# Patient Record
Sex: Male | Born: 1950 | Race: White | Hispanic: No | Marital: Married | Smoking: Current every day smoker
Health system: Southern US, Community
[De-identification: ages and names within clinical notes are randomized; demographics above are authoritative.]

## PROBLEM LIST (undated history)

## (undated) DIAGNOSIS — F319 Bipolar disorder, unspecified: Secondary | ICD-10-CM

## (undated) DIAGNOSIS — F4310 Post-traumatic stress disorder, unspecified: Secondary | ICD-10-CM

## (undated) DIAGNOSIS — K219 Gastro-esophageal reflux disease without esophagitis: Secondary | ICD-10-CM

## (undated) DIAGNOSIS — S069X9A Unspecified intracranial injury with loss of consciousness of unspecified duration, initial encounter: Secondary | ICD-10-CM

## (undated) DIAGNOSIS — I10 Essential (primary) hypertension: Secondary | ICD-10-CM

## (undated) DIAGNOSIS — S069XAA Unspecified intracranial injury with loss of consciousness status unknown, initial encounter: Secondary | ICD-10-CM

## (undated) HISTORY — PX: OTHER SURGICAL HISTORY: SHX169

---

## 1999-03-28 DIAGNOSIS — E781 Pure hyperglyceridemia: Secondary | ICD-10-CM | POA: Insufficient documentation

## 2000-05-01 DIAGNOSIS — G43909 Migraine, unspecified, not intractable, without status migrainosus: Secondary | ICD-10-CM | POA: Insufficient documentation

## 2004-06-07 ENCOUNTER — Emergency Department: Payer: Self-pay | Admitting: Unknown Physician Specialty

## 2004-06-20 ENCOUNTER — Emergency Department: Payer: Self-pay | Admitting: Emergency Medicine

## 2006-06-26 ENCOUNTER — Ambulatory Visit: Payer: Self-pay | Admitting: Family Medicine

## 2006-06-27 ENCOUNTER — Ambulatory Visit: Payer: Self-pay | Admitting: *Deleted

## 2008-03-27 HISTORY — PX: HERNIA REPAIR: SHX51

## 2009-08-12 ENCOUNTER — Ambulatory Visit: Payer: Self-pay | Admitting: Gastroenterology

## 2012-06-03 ENCOUNTER — Emergency Department: Payer: Self-pay | Admitting: Emergency Medicine

## 2012-06-03 LAB — DRUG SCREEN, URINE
Amphetamines, Ur Screen: NEGATIVE (ref ?–1000)
Barbiturates, Ur Screen: NEGATIVE (ref ?–200)
Benzodiazepine, Ur Scrn: POSITIVE (ref ?–200)
Cannabinoid 50 Ng, Ur ~~LOC~~: NEGATIVE (ref ?–50)
Cocaine Metabolite,Ur ~~LOC~~: NEGATIVE (ref ?–300)
Phencyclidine (PCP) Ur S: NEGATIVE (ref ?–25)

## 2012-06-03 LAB — COMPREHENSIVE METABOLIC PANEL
Albumin: 4.2 g/dL (ref 3.4–5.0)
Alkaline Phosphatase: 75 U/L (ref 50–136)
Anion Gap: 6 — ABNORMAL LOW (ref 7–16)
BUN: 15 mg/dL (ref 7–18)
Bilirubin,Total: 0.4 mg/dL (ref 0.2–1.0)
Calcium, Total: 8.8 mg/dL (ref 8.5–10.1)
Co2: 29 mmol/L (ref 21–32)
EGFR (African American): >60
EGFR (Non-African Amer.): >60
Glucose: 92 mg/dL (ref 65–99)
Osmolality: 276 (ref 275–301)
Sodium: 138 mmol/L (ref 136–145)

## 2012-06-03 LAB — CBC
HCT: 44.5 % (ref 40.0–52.0)
MCH: 31.9 pg (ref 26.0–34.0)
MCHC: 34.5 g/dL (ref 32.0–36.0)
Platelet: 227 10*3/uL (ref 150–440)
RDW: 12.8 % (ref 11.5–14.5)
WBC: 7.4 10*3/uL (ref 3.8–10.6)

## 2012-06-03 LAB — URINALYSIS, COMPLETE
Glucose,UR: NEGATIVE mg/dL (ref 0–75)
Nitrite: NEGATIVE
Protein: NEGATIVE
Specific Gravity: 1.005 (ref 1.003–1.030)

## 2012-06-03 LAB — ETHANOL: Ethanol: <3 mg/dL

## 2012-06-04 LAB — CARBAMAZEPINE LEVEL, TOTAL: Carbamazepine: 5 ug/mL (ref 4.0–12.0)

## 2013-03-31 DIAGNOSIS — F609 Personality disorder, unspecified: Secondary | ICD-10-CM | POA: Diagnosis not present

## 2013-03-31 DIAGNOSIS — F3110 Bipolar disorder, current episode manic without psychotic features, unspecified: Secondary | ICD-10-CM | POA: Diagnosis not present

## 2013-03-31 DIAGNOSIS — F4310 Post-traumatic stress disorder, unspecified: Secondary | ICD-10-CM | POA: Diagnosis not present

## 2013-07-14 DIAGNOSIS — F4310 Post-traumatic stress disorder, unspecified: Secondary | ICD-10-CM | POA: Diagnosis not present

## 2013-07-14 DIAGNOSIS — F3110 Bipolar disorder, current episode manic without psychotic features, unspecified: Secondary | ICD-10-CM | POA: Diagnosis not present

## 2013-10-10 DIAGNOSIS — F3110 Bipolar disorder, current episode manic without psychotic features, unspecified: Secondary | ICD-10-CM | POA: Diagnosis not present

## 2013-10-10 DIAGNOSIS — F4310 Post-traumatic stress disorder, unspecified: Secondary | ICD-10-CM | POA: Diagnosis not present

## 2013-12-26 DIAGNOSIS — Z23 Encounter for immunization: Secondary | ICD-10-CM | POA: Diagnosis not present

## 2014-02-09 DIAGNOSIS — Z79899 Other long term (current) drug therapy: Secondary | ICD-10-CM | POA: Diagnosis not present
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
## 2014-09-14 DIAGNOSIS — H2513 Age-related nuclear cataract, bilateral: Secondary | ICD-10-CM | POA: Diagnosis not present

## 2014-12-02 ENCOUNTER — Encounter: Payer: Self-pay | Admitting: Emergency Medicine

## 2014-12-02 ENCOUNTER — Emergency Department
Admission: EM | Admit: 2014-12-02 | Discharge: 2014-12-04 | Disposition: A | Discharge status: Home / Self Care | Payer: Medicare Other | Attending: Emergency Medicine | Admitting: Emergency Medicine

## 2014-12-02 DIAGNOSIS — F319 Bipolar disorder, unspecified: Secondary | ICD-10-CM | POA: Insufficient documentation

## 2014-12-02 DIAGNOSIS — F4310 Post-traumatic stress disorder, unspecified: Secondary | ICD-10-CM

## 2014-12-02 DIAGNOSIS — R45851 Suicidal ideations: Secondary | ICD-10-CM

## 2014-12-02 DIAGNOSIS — F1410 Cocaine abuse, uncomplicated: Secondary | ICD-10-CM | POA: Insufficient documentation

## 2014-12-02 DIAGNOSIS — S069X9S Unspecified intracranial injury with loss of consciousness of unspecified duration, sequela: Secondary | ICD-10-CM

## 2014-12-02 DIAGNOSIS — R45850 Homicidal ideations: Secondary | ICD-10-CM | POA: Diagnosis not present

## 2014-12-02 DIAGNOSIS — R440 Auditory hallucinations: Secondary | ICD-10-CM

## 2014-12-02 DIAGNOSIS — I10 Essential (primary) hypertension: Secondary | ICD-10-CM | POA: Diagnosis not present

## 2014-12-02 DIAGNOSIS — F0280 Dementia in other diseases classified elsewhere without behavioral disturbance: Secondary | ICD-10-CM

## 2014-12-02 DIAGNOSIS — S069XAS Unspecified intracranial injury with loss of consciousness status unknown, sequela: Secondary | ICD-10-CM

## 2014-12-02 DIAGNOSIS — Z720 Tobacco use: Secondary | ICD-10-CM | POA: Insufficient documentation

## 2014-12-02 HISTORY — DX: Bipolar disorder, unspecified: F31.9

## 2014-12-02 HISTORY — DX: Post-traumatic stress disorder, unspecified: F43.10

## 2014-12-02 HISTORY — DX: Essential (primary) hypertension: I10

## 2014-12-02 LAB — URINE DRUG SCREEN, QUALITATIVE (ARMC ONLY)
Amphetamines, Ur Screen: NOT DETECTED
BARBITURATES, UR SCREEN: NOT DETECTED
BENZODIAZEPINE, UR SCRN: NOT DETECTED
COCAINE METABOLITE, UR ~~LOC~~: POSITIVE — AB
Cannabinoid 50 Ng, Ur ~~LOC~~: NOT DETECTED
MDMA (Ecstasy)Ur Screen: NOT DETECTED
METHADONE SCREEN, URINE: NOT DETECTED
Opiate, Ur Screen: NOT DETECTED
Phencyclidine (PCP) Ur S: NOT DETECTED
TRICYCLIC, UR SCREEN: NOT DETECTED

## 2014-12-02 LAB — CBC
HCT: 41.8 % (ref 40.0–52.0)
HEMOGLOBIN: 14.5 g/dL (ref 13.0–18.0)
MCH: 32.5 pg (ref 26.0–34.0)
MCHC: 34.6 g/dL (ref 32.0–36.0)
MCV: 94 fL (ref 80.0–100.0)
Platelets: 200 10*3/uL (ref 150–440)
RBC: 4.45 MIL/uL (ref 4.40–5.90)
RDW: 13 % (ref 11.5–14.5)
WBC: 8.6 10*3/uL (ref 3.8–10.6)

## 2014-12-02 LAB — COMPREHENSIVE METABOLIC PANEL
ALK PHOS: 63 U/L (ref 38–126)
ALT: 23 U/L (ref 17–63)
ANION GAP: 9 (ref 5–15)
AST: 32 U/L (ref 15–41)
Albumin: 4.5 g/dL (ref 3.5–5.0)
BUN: 19 mg/dL (ref 6–20)
CALCIUM: 10.1 mg/dL (ref 8.9–10.3)
CO2: 27 mmol/L (ref 22–32)
Chloride: 103 mmol/L (ref 101–111)
Creatinine, Ser: 1.23 mg/dL (ref 0.61–1.24)
GFR calc non Af Amer: >60 mL/min (ref >60)
Glucose, Bld: 84 mg/dL (ref 65–99)
POTASSIUM: 3.9 mmol/L (ref 3.5–5.1)
SODIUM: 139 mmol/L (ref 135–145)
Total Bilirubin: 0.6 mg/dL (ref 0.3–1.2)
Total Protein: 7.6 g/dL (ref 6.5–8.1)

## 2014-12-02 LAB — SALICYLATE LEVEL

## 2014-12-02 LAB — ETHANOL: Alcohol, Ethyl (B): <5 mg/dL (ref <5)

## 2014-12-02 LAB — ACETAMINOPHEN LEVEL
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
## 2014-12-02 NOTE — ED Notes (Signed)
BEHAVIORAL HEALTH ROUNDING Patient sleeping: Yes.   Patient alert and oriented: yes Behavior appropriate: Yes.  ; If no, describe:  Nutrition and fluids offered: Yes  Toileting and hygiene offered: Yes  Sitter present: no Law enforcement present: Yes  

## 2014-12-02 NOTE — ED Notes (Signed)
Pt states he lost his apartment, pt states he did not take his medication today like he should, pt states he is hearing and seeing things but denies any thoughts of SI or HI, pt states he may have done drugs and ETOH today but he isn't sure

## 2014-12-02 NOTE — ED Notes (Signed)

## 2014-12-02 NOTE — ED Notes (Signed)
BEHAVIORAL HEALTH ROUNDING Patient sleeping: No. Patient alert and oriented: yes Behavior appropriate: Yes.  ; If no, describe:  Nutrition and fluids offered: Yes  Toileting and hygiene offered: Yes  Sitter present: no Law enforcement present: Yes  

## 2014-12-02 NOTE — ED Notes (Signed)
Pt to ED voluntary via [employer] with thoughts of harming self and hallicinations

## 2014-12-02 NOTE — ED Provider Notes (Signed)
[facility] Emergency Department Provider Note  Time seen: 6:49 PM  I have reviewed the triage vital signs and the nursing notes.   HISTORY  Chief Complaint mental health evaluation     HPI Carl Ortega is a 64 y.o. male with a past medical history of PTSD, bipolar, hypertension who presents the emergency department with suicidal and homicidal thoughts. According to the patient for the past several days he has been having increased thoughts of hurting himself and other people, as well as hearing voices that are not there. Patient states he is supposed be taking medication but he has not been taking it. He also states he recently lost his apartment. Patient does not know if he drink alcohol or do any drugs today or recently. He states he might have. He does admit using cocaine from time to time but cannot tell me when the last time he used it. He also admits marijuana use occasionally but again cannot tell me the last time he used it. He also admits alcohol, but cannot tell me the last time he used it. Denies any medical complaints today.     Past Medical History  Diagnosis Date  . Hypertension   . Bipolar 1 disorder   . PTSD (post-traumatic stress disorder)     There are no active problems to display for this patient.   History reviewed. No pertinent past surgical history.  No current outpatient prescriptions on file.  Allergies Review of patient's allergies indicates no known allergies.  No family history on file.  Social History Social History  Substance Use Topics  . Smoking status: Current Every Day Smoker  . Smokeless tobacco: None  . Alcohol Use: Yes    Review of Systems Constitutional: Negative for fever. Cardiovascular: Negative for chest pain. Respiratory: Negative for shortness of breath. Gastrointestinal: Negative for abdominal pain Neurological: Negative for headache Psychiatric: States recent SI and HI no active plan 10-point  ROS otherwise negative.  ____________________________________________   PHYSICAL EXAM:  VITAL SIGNS: ED Triage Vitals  Enc Vitals Group     BP 12/02/14 1723 160/97 mmHg     Pulse Rate 12/02/14 1723 83     Resp 12/02/14 1723 18     Temp 12/02/14 1723 98 F (36.7 C)     Temp Source 12/02/14 1723 Oral     SpO2 12/02/14 1723 96 %     Weight 12/02/14 1723 160 lb (72.576 kg)     Height 12/02/14 1723 5\' 8"  (1.727 m)     Head Cir --      Peak Flow --      Pain Score 12/02/14 1724 8     Pain Loc --      Pain Edu? --      Excl. in [place]? --     Constitutional: Alert and oriented. No acute distress. Eyes: Normal exam ENT   Head: Normocephalic and atraumatic Cardiovascular: Normal rate, regular rhythm. No murmur Respiratory: Normal respiratory effort without tachypnea nor retractions. Breath sounds are clear and equal bilaterally. No wheezes/rales/rhonchi. Gastrointestinal: Soft and nontender. No distention.   Musculoskeletal: Nontender with normal range of motion in all extremities.  Neurologic:  Normal speech and language. No gross focal neurologic deficits Skin:  Skin is warm, dry and intact.  Psychiatric: Admits hearing voices at times, admit suicidal and homicidal ideations with no active plan  ____________________________________________   INITIAL IMPRESSION / ASSESSMENT AND PLAN / ED COURSE  Pertinent labs & imaging results that were available  during my care of the patient were reviewed by me and considered in my medical decision making (see chart for details).  Patient presents for SI and HI. States he has been hearing voices. We will check labs, discussed with psychiatry. Given the patient's suicidal ideation we will commit the patient to the hospital until he can be appropriately evaluated by psychiatry.  ____________________________________________   FINAL CLINICAL IMPRESSION(S) / ED DIAGNOSES  Suicidal ideation Homicidal ideation Auditory  Hallucinations   Harvest Dark, MD 12/02/14 [phone]

## 2014-12-02 NOTE — ED Notes (Signed)

## 2014-12-03 DIAGNOSIS — F4310 Post-traumatic stress disorder, unspecified: Secondary | ICD-10-CM

## 2014-12-03 DIAGNOSIS — S069XAS Unspecified intracranial injury with loss of consciousness status unknown, sequela: Secondary | ICD-10-CM

## 2014-12-03 DIAGNOSIS — S069X9S Unspecified intracranial injury with loss of consciousness of unspecified duration, sequela: Secondary | ICD-10-CM

## 2014-12-03 DIAGNOSIS — F0280 Dementia in other diseases classified elsewhere without behavioral disturbance: Secondary | ICD-10-CM

## 2014-12-03 DIAGNOSIS — F1410 Cocaine abuse, uncomplicated: Secondary | ICD-10-CM

## 2014-12-03 DIAGNOSIS — F319 Bipolar disorder, unspecified: Secondary | ICD-10-CM | POA: Diagnosis not present

## 2014-12-03 DIAGNOSIS — I10 Essential (primary) hypertension: Secondary | ICD-10-CM

## 2014-12-03 HISTORY — DX: Cocaine abuse, uncomplicated: F14.10

## 2014-12-03 MED ORDER — LISINOPRIL 10 MG PO TABS
10.0000 mg | ORAL_TABLET | Freq: Every day | ORAL | Status: DC
  Administered 2014-12-03 – 2014-12-04 (×2): 10 mg via ORAL
  Filled 2014-12-03 (×2): qty 1

## 2014-12-03 NOTE — ED Notes (Signed)
Patient resting comfortably in room. No complaints or concerns voiced. No distress or abnormal behavior noted. Will continue to monitor with security cameras. Q 15 minute rounds continue.

## 2014-12-03 NOTE — ED Notes (Signed)
Lunch provided along with an extra drink  Pt observed with no unusual behavior  Appropriate to stimulation  No verbalized needs or concerns at this time  NAD assessed  Continue to monitor 

## 2014-12-03 NOTE — ED Notes (Signed)
BEHAVIORAL HEALTH ROUNDING Patient sleeping: No. Patient alert and oriented: yes Behavior appropriate: Yes.  ; If no, describe:  Nutrition and fluids offered: yes Toileting and hygiene offered: Yes  Sitter present: q15 minute observations and security camera monitoring Law enforcement present: Yes  ODS  

## 2014-12-03 NOTE — ED Notes (Signed)
Pt. Noted in room. No complaints or concerns voiced. No distress or abnormal behavior noted. Will continue to monitor with security cameras. Q 15 minute rounds continue. 

## 2014-12-03 NOTE — ED Notes (Signed)
BEHAVIORAL HEALTH ROUNDING Patient sleeping: No. Patient alert and oriented: yes Behavior appropriate: Yes.  ; If no, describe:  Nutrition and fluids offered: Yes  Toileting and hygiene offered: Yes  Sitter present: no Law enforcement present: Yes  

## 2014-12-03 NOTE — ED Notes (Signed)
Report received from Amy, RN. Pt. Alert and oriented in no distress; verbalizes SI, HI,  With auditory hallucinations; telling him what to do; denies. pain.  Pt. Instructed to come to me with problems or concerns.Will continue to monitor for safety via security cameras and Q 15 minute checks.

## 2014-12-03 NOTE — ED Notes (Signed)
Report given to Damare Serano H from [place] - working in [employer]

## 2014-12-03 NOTE — ED Notes (Signed)
ED BHU [place] Is the patient under IVC or is there intent for IVC: Yes.   Is the patient medically cleared: Yes.   Is there vacancy in the ED BHU: Yes.   Is the population mix appropriate for patient: Yes.   Is the patient awaiting placement in inpatient or outpatient setting: No. Has the patient had a psychiatric consult: consult pending Survey of unit performed for contraband, proper placement and condition of furniture, tampering with fixtures in bathroom, shower, and each patient room: Yes.  ; Findings:  APPEARANCE/BEHAVIOR Calm and cooperative NEURO ASSESSMENT Orientation: oriented x3  Denies pain Hallucinations: No.None noted (Hallucinations) Speech: Normal Gait: normal RESPIRATORY ASSESSMENT even unlabored respirations  CARDIOVASCULAR ASSESSMENT Pulses equal  regular rate  Skin warm and dry GASTROINTESTINAL ASSESSMENT no GI complaint EXTREMITIES Full ROM PLAN OF CARE Provide calm/safe environment. Vital signs assessed twice daily. ED BHU Assessment once each 12-hour shift. Collaborate with intake RN daily or as condition indicates. Assure the ED provider has rounded once each shift. Provide and encourage hygiene. Provide redirection as needed. Assess for escalating behavior; address immediately and inform ED provider.  Assess family dynamic and appropriateness for visitation as needed: Yes.  ; If necessary, describe findings:  Educate the patient/family about BHU procedures/visitation: Yes.  ; If necessary, describe findings:

## 2014-12-03 NOTE — ED Notes (Signed)
Report received from [place] observed lying in hallway bed  Eyes closed  Appears to be sleeping Pt visualized with NAD  Continue to monitor
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
## 2014-12-03 NOTE — ED Notes (Signed)
Breakfast provided  Pt observed with no unusual behavior  Appropriate to stimulation  No verbalized needs or concerns at this time  NAD assessed  Continue to monitor 

## 2014-12-03 NOTE — ED Notes (Signed)
ENVIRONMENTAL ASSESSMENT Potentially harmful objects out of patient reach: Yes.   Personal belongings secured: Yes.   Patient dressed in hospital provided attire only: Yes.   Plastic bags out of patient reach: Yes.   Patient care equipment (cords, cables, call bells, lines, and drains) shortened, removed, or accounted for: Yes.   Equipment and supplies removed from bottom of stretcher: Yes.   Potentially toxic materials out of patient reach: Yes.   Sharps container removed or out of patient reach: Yes.     BEHAVIORAL HEALTH ROUNDING Patient sleeping: No. Patient alert and oriented: yes Behavior appropriate: Yes.  ; If no, describe:  Nutrition and fluids offered: yes Toileting and hygiene offered: Yes  Sitter present: q15 minute observations and security camera monitoring Law enforcement present: Yes  ODS  

## 2014-12-03 NOTE — ED Notes (Signed)
BEHAVIORAL HEALTH ROUNDING Patient sleeping: Yes.   Patient alert and oriented: yes Behavior appropriate: Yes.  ; If no, describe:  Nutrition and fluids offered: Yes  Toileting and hygiene offered: Yes  Sitter present: no Law enforcement present: Yes  

## 2014-12-03 NOTE — ED Notes (Signed)
Pt. Noted in the day room; c/o having a headache; requested medication; ER-MD Dr. Thomasene Lot was made aware; and medication to be ordered. No abnormal behavior noted. Will continue to monitor with security cameras. Q 15 minute rounds continue.

## 2014-12-03 NOTE — ED Notes (Signed)
His blood pressure is elevated this am  170/97  I questioned him about his home meds and he reported that the [place] in [place] has all of his information   Pharm tech notified  - pt gave pharm tech the required information and we will now wait for the information to come from them  EDP to be made aware of BP

## 2014-12-03 NOTE — ED Notes (Signed)
Pt. Noted in the day room talking with other clients and watching the t.v.. No complaints or concerns voiced. No distress or abnormal behavior noted. Will continue to monitor with security cameras. Q 15 minute rounds continue.

## 2014-12-03 NOTE — ED Notes (Signed)
Pt escorted with ED tech and [employer] officer  Pt ambulatory with a steady gait  Pt observed with no unusual behavior  Appropriate to stimulation  No verbalized needs or concerns at this time  NAD assessed  Continue to monitor

## 2014-12-03 NOTE — ED Notes (Signed)
md Clapacs is currently consulting with him

## 2014-12-03 NOTE — ED Notes (Signed)
BEHAVIORAL HEALTH ROUNDING Patient sleeping: Yes.   Patient alert and oriented: yes Behavior appropriate: Yes.  ; If no, describe:  Nutrition and fluids offered: Yes  Toileting and hygiene offered: Yes  Sitter present: not applicable Law enforcement present: Yes  

## 2014-12-03 NOTE — ED Notes (Signed)
BEHAVIORAL HEALTH ROUNDING Patient sleeping: Yes.   Patient alert and oriented: yes Behavior appropriate: Yes.  ; If no, describe:  Nutrition and fluids offered: No Toileting and hygiene offered: Yes  Sitter present: no Law enforcement present: Yes

## 2014-12-03 NOTE — ED Notes (Signed)
BEHAVIORAL HEALTH ROUNDING Patient sleeping: No. Patient alert and oriented: yes Behavior appropriate: Yes.  ; If no, describe:  Nutrition and fluids offered: Yes  Toileting and hygiene offered: Yes  Sitter present: not applicable Law enforcement present: Yes  

## 2014-12-03 NOTE — ED Notes (Signed)
Med administered as ordered   Educated pt about the med and the plan to move him from a hallway bed over to the holding unit where he will have a private room   Pt agrees with the plan

## 2014-12-03 NOTE — ED Notes (Signed)
Patient assigned to appropriate care area. Patient oriented to unit/care area: Informed that, for their safety, care areas are designed for safety and monitored by security cameras at all times; and visiting hours explained to patient. Patient verbalizes understanding, and verbal contract for safety obtained.   ENVIRONMENTAL ASSESSMENT Potentially harmful objects out of patient reach: Yes.   Personal belongings secured: Yes.   Patient dressed in hospital provided attire only: Yes.   Plastic bags out of patient reach: Yes.   Patient care equipment (cords, cables, call bells, lines, and drains) shortened, removed, or accounted for: Yes.   Equipment and supplies removed from bottom of stretcher: Yes.   Potentially toxic materials out of patient reach: Yes.   Sharps container removed or out of patient reach: Yes.   

## 2014-12-03 NOTE — ED Notes (Signed)
Pt. Noted in the day  Room talking with other clients and watching the t.v.. No complaints or concerns voiced. No distress or abnormal behavior noted. Will continue to monitor with security cameras. Q 15 minute rounds continue.

## 2014-12-03 NOTE — ED Notes (Signed)
Breakfast ordered 

## 2014-12-04 DIAGNOSIS — F4310 Post-traumatic stress disorder, unspecified: Secondary | ICD-10-CM | POA: Diagnosis not present

## 2014-12-04 DIAGNOSIS — F319 Bipolar disorder, unspecified: Secondary | ICD-10-CM | POA: Diagnosis not present

## 2014-12-04 MED ORDER — ACETAMINOPHEN 325 MG PO TABS
650.0000 mg | ORAL_TABLET | Freq: Once | ORAL | Status: AC
  Administered 2014-12-04: 650 mg via ORAL
  Filled 2014-12-04: qty 2

## 2014-12-04 MED ORDER — ACETAMINOPHEN 500 MG PO TABS
1000.0000 mg | ORAL_TABLET | Freq: Four times a day (QID) | ORAL | Status: DC | PRN
  Administered 2014-12-04: 1000 mg via ORAL
  Filled 2014-12-04: qty 2

## 2014-12-04 MED ORDER — ACETAMINOPHEN 325 MG PO TABS
ORAL_TABLET | ORAL | Status: AC
  Filled 2014-12-04: qty 2

## 2014-12-04 NOTE — [facility] Counselor (Signed)
Per request of Psych  MD (Dr. Clapacs), writer provided the pt. with information and instructions on how to access Outpatient Mental Health & Substance Abuse Treatment ([facility]). 
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
## 2014-12-04 NOTE — ED Notes (Signed)
Pain on discharge is 8/10 acute pain for headache.

## 2014-12-04 NOTE — ED Notes (Signed)
Pt. Noted in room. No complaints or concerns voiced. No distress or abnormal behavior noted. Will continue to monitor with security cameras. Q 15 minute rounds continue. 

## 2014-12-04 NOTE — Progress Notes (Signed)
CSW informed that patient will discharge to shelter, has no transportation.    CSW provided patient with $1.00 from petty cash, a bus schedule and information for shelter.    Carl Ortega. [name], MSW Clinical Social Work Department Emergency Room [phone] 4:41 PM

## 2014-12-04 NOTE — ED Notes (Signed)

## 2014-12-04 NOTE — ED Notes (Signed)

## 2014-12-04 NOTE — ED Notes (Signed)
Patient is resting comfortably. 

## 2014-12-04 NOTE — ED Notes (Signed)
BEHAVIORAL HEALTH ROUNDING Patient sleeping: No. Patient alert and oriented: yes Behavior appropriate: Yes.  ; If no, describe:  Nutrition and fluids offered: Yes  Toileting and hygiene offered: Yes  Sitter present: ED tech performing every 15 minute checks. Law enforcement present: Yes  and [employer]. 

## 2014-12-04 NOTE — ED Provider Notes (Signed)
-----------------------------------------   3:20 PM on 12/04/2014 -----------------------------------------  Patient seen by Dr. Weber Cooks who advises discharge. Patient has outpatient resources referred to him. Patient IVC removed by Dr. Weber Cooks.  Delman Kitten, MD 12/04/14 1520

## 2014-12-04 NOTE — Discharge Instructions (Signed)
You have been seen in the Emergency Department (ED) today for a psychiatric complaint.  You have been evaluated by psychiatry and we believe you are safe to be discharged from the hospital.    Please return to the ED immediately if you have ANY thoughts of hurting yourself or anyone else, so that we may help you.  Please avoid alcohol and drug use.  Follow up with your doctor and/or therapist as soon as possible regarding today's ED visit.   Please follow up any other recommendations and clinic appointments provided by the psychiatry team that saw you in the Emergency Department.   Major Depressive Disorder Major depressive disorder is a mental illness. It also may be called clinical depression or unipolar depression. Major depressive disorder usually causes feelings of sadness, hopelessness, or helplessness. Some people with this disorder do not feel particularly sad but lose interest in doing things they used to enjoy (anhedonia). Major depressive disorder also can cause physical symptoms. It can interfere with work, school, relationships, and other normal everyday activities. The disorder varies in severity but is longer lasting and more serious than the sadness we all feel from time to time in our lives. Major depressive disorder often is triggered by stressful life events or major life changes. Examples of these triggers include divorce, loss of your job or home, a move, and the death of a family member or close friend. Sometimes this disorder occurs for no obvious reason at all. People who have family members with major depressive disorder or bipolar disorder are at higher risk for developing this disorder, with or without life stressors. Major depressive disorder can occur at any age. It may occur just once in your life (single episode major depressive disorder). It may occur multiple times (recurrent major depressive disorder). SYMPTOMS People with major depressive disorder have either anhedonia  or depressed mood on nearly a daily basis for at least 2 weeks or longer. Symptoms of depressed mood include:  Feelings of sadness (blue or down in the dumps) or emptiness.  Feelings of hopelessness or helplessness.  Tearfulness or episodes of crying (may be observed by others).  Irritability (children and adolescents). In addition to depressed mood or anhedonia or both, people with this disorder have at least four of the following symptoms:  Difficulty sleeping or sleeping too much.   Significant change (increase or decrease) in appetite or weight.   Lack of energy or motivation.  Feelings of guilt and worthlessness.   Difficulty concentrating, remembering, or making decisions.  Unusually slow movement (psychomotor retardation) or restlessness (as observed by others).   Recurrent wishes for death, recurrent thoughts of self-harm (suicide), or a suicide attempt. People with major depressive disorder commonly have persistent negative thoughts about themselves, other people, and the world. People with severe major depressive disorder may experiencedistorted beliefs or perceptions about the world (psychotic delusions). They also may see or hear things that are not real (psychotic hallucinations). DIAGNOSIS Major depressive disorder is diagnosed through an assessment by your health care provider. Your health care provider will ask aboutaspects of your daily life, such as mood,sleep, and appetite, to see if you have the diagnostic symptoms of major depressive disorder. Your health care provider may ask about your medical history and use of alcohol or drugs, including prescription medicines. Your health care provider also may do a physical exam and blood work. This is because certain medical conditions and the use of certain substances can cause major depressive disorder-like symptoms (secondary depression). Your health  care provider also may refer you to a mental health specialist for  further evaluation and treatment. TREATMENT It is important to recognize the symptoms of major depressive disorder and seek treatment. The following treatments can be prescribed for this disorder:   Medicine. Antidepressant medicines usually are prescribed. Antidepressant medicines are thought to correct chemical imbalances in the brain that are commonly associated with major depressive disorder. Other types of medicine may be added if the symptoms do not respond to antidepressant medicines alone or if psychotic delusions or hallucinations occur.  Talk therapy. Talk therapy can be helpful in treating major depressive disorder by providing support, education, and guidance. Certain types of talk therapy also can help with negative thinking (cognitive behavioral therapy) and with relationship issues that trigger this disorder (interpersonal therapy). A mental health specialist can help determine which treatment is best for you. Most people with major depressive disorder do well with a combination of medicine and talk therapy. Treatments involving electrical stimulation of the brain can be used in situations with extremely severe symptoms or when medicine and talk therapy do not work over time. These treatments include electroconvulsive therapy, transcranial magnetic stimulation, and vagal nerve stimulation. Document Released: 07/08/2012 Document Revised: 07/28/2013 Document Reviewed: 07/08/2012 [facility] Patient Information 2015 [place], [place]. This information is not intended to replace advice given to you by your health care provider. Make sure you discuss any questions you have with your health care provider.
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
## 2014-12-04 NOTE — ED Notes (Signed)
BEHAVIORAL HEALTH ROUNDING Patient sleeping: No. Patient alert and oriented: yes Behavior appropriate: Yes.  ; If no, describe:  Nutrition and fluids offered: Yes  Toileting and hygiene offered: Yes  Sitter present: ED performing 15 minute checks Law enforcement present: Yes  [place]

## 2014-12-04 NOTE — ED Provider Notes (Signed)
-----------------------------------------   5:11 AM on 12/04/2014 -----------------------------------------   BP 164/88 mmHg  Pulse 76  Temp(Src) 97.4 F (36.3 C) (Oral)  Resp 20  Ht 5\' 8"  (1.727 m)  Wt 160 lb (72.576 kg)  BMI 24.33 kg/m2  SpO2 100%  Patient was complaining of some headache overnight. Tylenol was ordered.  Acting appropriately.  Disposition is pending per Psychiatry/Behavioral Medicine team recommendations.     Nance Pear, MD 12/04/14 [phone]

## 2014-12-04 NOTE — BHH Counselor (Signed)
Spoke with [facility] AC (Eric) and discussed a possible placement at their facility. [facility] are currently at capacity. 

## 2014-12-04 NOTE — BHH Counselor (Signed)
Writer called [facility] [phone] ext [phone]) to follow up with referral. They are currently on diversion, system wide. Their are no mental health beds,within their system, in [place].

## 2015-03-13 ENCOUNTER — Emergency Department: Payer: Medicare Other

## 2015-03-13 ENCOUNTER — Inpatient Hospital Stay
Admission: EM | Admit: 2015-03-13 | Discharge: 2015-03-15 | DRG: 641 | Disposition: A | Discharge status: Home / Self Care | Payer: Medicare Other | Attending: Internal Medicine | Admitting: Internal Medicine

## 2015-03-13 ENCOUNTER — Encounter: Payer: Self-pay | Admitting: *Deleted

## 2015-03-13 DIAGNOSIS — E871 Hypo-osmolality and hyponatremia: Secondary | ICD-10-CM

## 2015-03-13 DIAGNOSIS — F4310 Post-traumatic stress disorder, unspecified: Secondary | ICD-10-CM | POA: Diagnosis present

## 2015-03-13 DIAGNOSIS — F0390 Unspecified dementia without behavioral disturbance: Secondary | ICD-10-CM | POA: Diagnosis present

## 2015-03-13 DIAGNOSIS — F319 Bipolar disorder, unspecified: Secondary | ICD-10-CM | POA: Diagnosis present

## 2015-03-13 DIAGNOSIS — Z87820 Personal history of traumatic brain injury: Secondary | ICD-10-CM | POA: Diagnosis not present

## 2015-03-13 DIAGNOSIS — I959 Hypotension, unspecified: Secondary | ICD-10-CM | POA: Diagnosis present

## 2015-03-13 DIAGNOSIS — Z888 Allergy status to other drugs, medicaments and biological substances status: Secondary | ICD-10-CM

## 2015-03-13 DIAGNOSIS — R05 Cough: Secondary | ICD-10-CM | POA: Diagnosis not present

## 2015-03-13 DIAGNOSIS — F17200 Nicotine dependence, unspecified, uncomplicated: Secondary | ICD-10-CM | POA: Diagnosis present

## 2015-03-13 DIAGNOSIS — E876 Hypokalemia: Secondary | ICD-10-CM

## 2015-03-13 DIAGNOSIS — I10 Essential (primary) hypertension: Secondary | ICD-10-CM | POA: Diagnosis present

## 2015-03-13 DIAGNOSIS — Z8249 Family history of ischemic heart disease and other diseases of the circulatory system: Secondary | ICD-10-CM

## 2015-03-13 DIAGNOSIS — E860 Dehydration: Secondary | ICD-10-CM | POA: Diagnosis present

## 2015-03-13 DIAGNOSIS — Z720 Tobacco use: Secondary | ICD-10-CM | POA: Diagnosis not present

## 2015-03-13 DIAGNOSIS — I700 Atherosclerosis of aorta: Secondary | ICD-10-CM | POA: Diagnosis not present

## 2015-03-13 HISTORY — DX: Unspecified intracranial injury with loss of consciousness status unknown, initial encounter: S06.9XAA

## 2015-03-13 HISTORY — DX: Unspecified intracranial injury with loss of consciousness of unspecified duration, initial encounter: S06.9X9A

## 2015-03-13 HISTORY — DX: Hypo-osmolality and hyponatremia: E87.1

## 2015-03-13 LAB — CBC WITH DIFFERENTIAL/PLATELET
Basophils Absolute: 0 10*3/uL (ref 0–0.1)
Basophils Relative: 0 %
EOS ABS: 0 10*3/uL (ref 0–0.7)
Eosinophils Relative: 0 %
HEMATOCRIT: 38.9 % — AB (ref 40.0–52.0)
HEMOGLOBIN: 13.8 g/dL (ref 13.0–18.0)
LYMPHS ABS: 1.7 10*3/uL (ref 1.0–3.6)
Lymphocytes Relative: 18 %
MCH: 31.3 pg (ref 26.0–34.0)
MCHC: 35.6 g/dL (ref 32.0–36.0)
MCV: 88.1 fL (ref 80.0–100.0)
MONOS PCT: 14 %
Monocytes Absolute: 1.3 10*3/uL — ABNORMAL HIGH (ref 0.2–1.0)
NEUTROS ABS: 6.2 10*3/uL (ref 1.4–6.5)
NEUTROS PCT: 68 %
Platelets: 229 10*3/uL (ref 150–440)
RBC: 4.41 MIL/uL (ref 4.40–5.90)
RDW: 12.4 % (ref 11.5–14.5)
WBC: 9.2 10*3/uL (ref 3.8–10.6)

## 2015-03-13 LAB — COMPREHENSIVE METABOLIC PANEL
ALBUMIN: 4.4 g/dL (ref 3.5–5.0)
ALBUMIN: 4.1 g/dL (ref 3.5–5.0)
ALK PHOS: 64 U/L (ref 38–126)
ALT: 26 U/L (ref 17–63)
ALT: 24 U/L (ref 17–63)
ANION GAP: 5 (ref 5–15)
AST: 32 U/L (ref 15–41)
AST: 29 U/L (ref 15–41)
Alkaline Phosphatase: 62 U/L (ref 38–126)
Anion gap: 8 (ref 5–15)
BILIRUBIN TOTAL: 0.9 mg/dL (ref 0.3–1.2)
BUN: 12 mg/dL (ref 6–20)
BUN: 10 mg/dL (ref 6–20)
CALCIUM: 9.4 mg/dL (ref 8.9–10.3)
CALCIUM: 8.9 mg/dL (ref 8.9–10.3)
CO2: 27 mmol/L (ref 22–32)
CO2: 28 mmol/L (ref 22–32)
CREATININE: 1.22 mg/dL (ref 0.61–1.24)
Chloride: 83 mmol/L — ABNORMAL LOW (ref 101–111)
Chloride: 88 mmol/L — ABNORMAL LOW (ref 101–111)
Creatinine, Ser: 1.14 mg/dL (ref 0.61–1.24)
GFR calc Af Amer: >60 mL/min (ref >60)
GFR calc non Af Amer: >60 mL/min (ref >60)
GFR calc non Af Amer: >60 mL/min (ref >60)
GLUCOSE: 112 mg/dL — AB (ref 65–99)
GLUCOSE: 107 mg/dL — AB (ref 65–99)
Potassium: 3 mmol/L — ABNORMAL LOW (ref 3.5–5.1)
Potassium: 3.4 mmol/L — ABNORMAL LOW (ref 3.5–5.1)
SODIUM: 118 mmol/L — AB (ref 135–145)
Sodium: 121 mmol/L — ABNORMAL LOW (ref 135–145)
TOTAL PROTEIN: 7.4 g/dL (ref 6.5–8.1)
Total Bilirubin: 0.9 mg/dL (ref 0.3–1.2)
Total Protein: 6.9 g/dL (ref 6.5–8.1)

## 2015-03-13 LAB — CK: Total CK: 236 U/L (ref 49–397)

## 2015-03-13 LAB — OSMOLALITY: Osmolality: 251 mOsm/kg — ABNORMAL LOW (ref 275–295)

## 2015-03-13 LAB — TROPONIN I: Troponin I: <0.03 ng/mL (ref <0.031)

## 2015-03-13 MED ORDER — SODIUM CHLORIDE 0.9 % IV SOLN
INTRAVENOUS | Status: DC
Start: 2015-03-13 — End: 2015-03-15
  Administered 2015-03-13 – 2015-03-14 (×3): via INTRAVENOUS

## 2015-03-13 MED ORDER — INFLUENZA VAC SPLIT QUAD 0.5 ML IM SUSY
0.5000 mL | PREFILLED_SYRINGE | INTRAMUSCULAR | Status: AC
  Administered 2015-03-15: 0.5 mL via INTRAMUSCULAR
  Filled 2015-03-13: qty 0.5

## 2015-03-13 MED ORDER — GUAIFENESIN 100 MG/5ML PO SOLN
5.0000 mL | ORAL | Status: DC | PRN
  Filled 2015-03-13: qty 5

## 2015-03-13 MED ORDER — SODIUM CHLORIDE 0.9 % IV BOLUS (SEPSIS)
1000.0000 mL | Freq: Once | INTRAVENOUS | Status: AC
  Administered 2015-03-13: 1000 mL via INTRAVENOUS

## 2015-03-13 MED ORDER — AZITHROMYCIN 250 MG PO TABS
500.0000 mg | ORAL_TABLET | Freq: Every day | ORAL | Status: DC
  Administered 2015-03-13 – 2015-03-14 (×2): 500 mg via ORAL
  Filled 2015-03-13 (×2): qty 2

## 2015-03-13 MED ORDER — IOHEXOL 300 MG/ML  SOLN
75.0000 mL | Freq: Once | INTRAMUSCULAR | Status: AC | PRN
Start: 2015-03-13 — End: 2015-03-13
  Administered 2015-03-13: 75 mL via INTRAVENOUS
  Filled 2015-03-13: qty 75

## 2015-03-13 MED ORDER — ONDANSETRON 4 MG PO TBDP
4.0000 mg | ORAL_TABLET | Freq: Once | ORAL | Status: AC
  Administered 2015-03-13: 4 mg via ORAL
  Filled 2015-03-13: qty 1

## 2015-03-13 MED ORDER — SODIUM CHLORIDE 0.9 % IV BOLUS (SEPSIS): 1000.0000 mL | Freq: Once | INTRAVENOUS | Status: DC

## 2015-03-13 MED ORDER — ACETAMINOPHEN 325 MG PO TABS: 650.0000 mg | ORAL_TABLET | Freq: Four times a day (QID) | ORAL | Status: DC | PRN

## 2015-03-13 MED ORDER — ACETAMINOPHEN 650 MG RE SUPP: 650.0000 mg | Freq: Four times a day (QID) | RECTAL | Status: DC | PRN

## 2015-03-13 MED ORDER — POTASSIUM CHLORIDE CRYS ER 20 MEQ PO TBCR
40.0000 meq | EXTENDED_RELEASE_TABLET | Freq: Once | ORAL | Status: AC
  Administered 2015-03-13: 40 meq via ORAL
  Filled 2015-03-13: qty 2

## 2015-03-13 MED ORDER — HEPARIN SODIUM (PORCINE) 5000 UNIT/ML IJ SOLN
5000.0000 [IU] | Freq: Three times a day (TID) | INTRAMUSCULAR | Status: DC
  Administered 2015-03-13 – 2015-03-15 (×5): 5000 [IU] via SUBCUTANEOUS
  Filled 2015-03-13 (×5): qty 1

## 2015-03-13 MED ORDER — GUAIFENESIN ER 600 MG PO TB12
600.0000 mg | ORAL_TABLET | Freq: Two times a day (BID) | ORAL | Status: DC
  Administered 2015-03-13 – 2015-03-15 (×4): 600 mg via ORAL
  Filled 2015-03-13 (×5): qty 1

## 2015-03-13 NOTE — H&P (Signed)
[place] [facility] [place] NAME: Carl Ortega    MR#:  [id number]  DATE OF BIRTH:  1950-12-01  DATE OF ADMISSION:  03/13/2015  PRIMARY CARE PHYSICIAN: [place]  REQUESTING/REFERRING PHYSICIAN: Kizzie Bane  CHIEF COMPLAINT:   Chief Complaint  Patient presents with  . Headache  . Chills  . Generalized Body Aches    HISTORY OF PRESENT ILLNESS: Carl Ortega  is a 64 y.o. male with a known history of hypertension, bipolar disorder, PTSD, traumatic brain injury in the past was presenting to the emergency room complaining of headache generalized body aches. Cough and congestion. Patient is a very poor historian. He states that he was recently hospitalized at the [facility] for psychiatric admission. He states that there was something about his sodium being low. And they thought it was due to medication. He is unable to recall any of his medications. And we have no records available from the [facility]. He states that he normally has trouble with his memory due to his traumatic brain injury but is worse. And has not been feeling good. He is unable to give me much details of his history or his hospitalization. Denies excessive water ingestion    PAST MEDICAL HISTORY:   Past Medical History  Diagnosis Date  . Hypertension   . Bipolar 1 disorder ([place])   . PTSD (post-traumatic stress disorder)   . Brain injury [facility])     PAST SURGICAL HISTORY:  Past Surgical History  Procedure Laterality Date  . None      SOCIAL HISTORY:  Social History  Substance Use Topics  . Smoking status: Current Every Day Smoker  . Smokeless tobacco: Not on file  . Alcohol Use: 2.4 oz/week    4 Standard drinks or equivalent per week    FAMILY HISTORY:  Family History  Problem Relation Age of Onset  . Hypertension      DRUG ALLERGIES:  Allergies  Allergen Reactions  . Ketorolac Rash    REVIEW OF SYSTEMS:   CONSTITUTIONAL: No fever, fatigue  positive weakness.  EYES: No blurred or double vision.  EARS, NOSE, AND THROAT: No tinnitus or ear pain.  RESPIRATORY: No cough, shortness of breath, wheezing or hemoptysis.  CARDIOVASCULAR: No chest pain, orthopnea, edema.  GASTROINTESTINAL: No nausea, vomiting, diarrhea or abdominal pain.  GENITOURINARY: No dysuria, hematuria.  ENDOCRINE: No polyuria, nocturia,  HEMATOLOGY: No anemia, easy bruising or bleeding SKIN: No rash or lesion. MUSCULOSKELETAL: No joint pain or arthritis.   NEUROLOGIC: No tingling, numbness, positive weakness weakness. Positive for headache PSYCHIATRY: Has bipolar disorder, PTSD  MEDICATIONS AT HOME:  Prior to Admission medications   Not on File      PHYSICAL EXAMINATION:   VITAL SIGNS: Blood pressure 118/70, pulse 62, temperature 98.9 F (37.2 C), temperature source Oral, resp. rate 18, height 5\' 9"  (1.753 m), weight 72.576 kg (160 lb), SpO2 100 %.  GENERAL:  64 y.o.-year-old patient lying in the bed with no acute distress.  EYES: Pupils equal, round, reactive to light and accommodation. No scleral icterus. Extraocular muscles intact.  HEENT: Head atraumatic, normocephalic. Oropharynx and nasopharynx clear.  NECK:  Supple, no jugular venous distention. No thyroid enlargement, no tenderness.  LUNGS: Normal breath sounds bilaterally, no wheezing, rales,rhonchi or crepitation. No use of accessory muscles of respiration.  CARDIOVASCULAR: S1, S2 normal. No murmurs, rubs, or gallops.  ABDOMEN: Soft, nontender, nondistended. Bowel sounds present. No organomegaly or mass.  EXTREMITIES: No pedal  edema, cyanosis, or clubbing.  NEUROLOGIC: Cranial nerves II through XII are intact. Muscle strength 5/5 in all extremities. Sensation intact. Gait not checked.  PSYCHIATRIC: The patient is alert and oriented x 3.  SKIN: No obvious rash, lesion, or ulcer.   LABORATORY PANEL:   CBC  Recent Labs Lab 03/13/15 1531  WBC 9.2  HGB 13.8  HCT 38.9*  PLT 229  MCV 88.1   MCH 31.3  MCHC 35.6  RDW 12.4  LYMPHSABS 1.7  MONOABS 1.3*  EOSABS 0.0  BASOSABS 0.0   ------------------------------------------------------------------------------------------------------------------  Chemistries   Recent Labs Lab 03/13/15 1531  NA 118*  K 3.0*  CL 83*  CO2 27  GLUCOSE 112*  BUN 12  CREATININE 1.22  CALCIUM 9.4  AST 32  ALT 26  ALKPHOS 64  BILITOT 0.9   ------------------------------------------------------------------------------------------------------------------ estimated creatinine clearance is 61.2 mL/min (by C-G formula based on Cr of 1.22). ------------------------------------------------------------------------------------------------------------------ No results for input(s): TSH, T4TOTAL, T3FREE, THYROIDAB in the last 72 hours.  Invalid input(s): FREET3   Coagulation profile No results for input(s): INR, PROTIME in the last 168 hours. ------------------------------------------------------------------------------------------------------------------- No results for input(s): DDIMER in the last 72 hours. -------------------------------------------------------------------------------------------------------------------  Cardiac Enzymes  Recent Labs Lab 03/13/15 1531  TROPONINI <0.03   ------------------------------------------------------------------------------------------------------------------ Invalid input(s): POCBNP  ---------------------------------------------------------------------------------------------------------------  Urinalysis    Component Value Date/Time   COLORURINE Straw 06/03/2012 1435   APPEARANCEUR Clear 06/03/2012 1435   LABSPEC 1.005 06/03/2012 1435   PHURINE 6.0 06/03/2012 1435   GLUCOSEU Negative 06/03/2012 1435   HGBUR Negative 06/03/2012 1435   BILIRUBINUR Negative 06/03/2012 1435   KETONESUR Negative 06/03/2012 1435   PROTEINUR Negative 06/03/2012 1435   NITRITE Negative 06/03/2012 1435    LEUKOCYTESUR Negative 06/03/2012 1435     RADIOLOGY: Dg Chest 2 View  03/13/2015  CLINICAL DATA:  Cough, congestion, chills, and weakness for 1 week. EXAM: CHEST  2 VIEW COMPARISON:  None. FINDINGS: Rounded density projects over the cardiac apex on the lateral view. Increased AP diameter of the chest. No pneumothorax or pleural effusion. Normal heart size. IMPRESSION: Rounded density projects over the cardiac apex on the lateral view. Lung mass is not excluded. Comparison with prior radiographs is recommended. If none are available, CT can be performed. Electronically Signed   By: Marybelle Killings M.D.   On: 03/13/2015 15:00   Ct Chest W Contrast  03/13/2015  CLINICAL DATA:  Dementia, nasal congestion, cough and chills for 1 week, abnormal chest x-ray EXAM: CT CHEST WITH CONTRAST TECHNIQUE: Multidetector CT imaging of the chest was performed during intravenous contrast administration. Sagittal and coronal MPR images reconstructed from axial data set. CONTRAST:  41mL OMNIPAQUE IOHEXOL 300 MG/ML  SOLN IV COMPARISON:  None; correlation chest radiograph 03/13/2015 FINDINGS: Thoracic vascular structures patent on nondedicated exam. Atherosclerotic calcification aortic arch and coronary arteries. No thoracic adenopathy. Visualized upper abdomen unremarkable. Lungs clear. No pulmonary infiltrate, pleural effusion, pneumothorax or mass/ nodule. Specifically no anterior lung base mass or opacity is identified to account for radiographic findings. Suspect radiographic abnormality was related to superimposed chest wall structures. No acute osseous findings. IMPRESSION: Minimal atherosclerotic calcification aorta and coronary arteries. Otherwise normal CT chest. Electronically Signed   By: Lavonia Dana M.D.   On: 03/13/2015 17:34    EKG: Orders placed or performed during the hospital encounter of 03/13/15  . ED EKG  . ED EKG    IMPRESSION AND PLAN: Patient is a 64 year old white male presented with body aches  weakness cough congestion noted to  have hyponatremia severe and hypokalemia  1. Hyponatremia- could be related to dehydration, he denies excessive fluid ingestion. Also I am not sure what medication he is on and he is not aware of what medications he takes. We will have to obtain that but that could be a possibility. We'll provider made slow normal saline infusion if his sodium does not improve by tomorrow then we will able asked nephrology to see. I'll check serum and urine osmolality  2. Bipolar disorder and PTSD resume medications once known what medication he is on.  3. Hypertension blood pressure currently normal we'll resume his home medication once available  4. Nicotine addiction smoking cessation provided for minutes spent patient will be started on nicotine patch strongly recommended he stop smoking       ll the records are reviewed and case discussed with ED provider. Management plans discussed with the patient, family and they are in agreement.  CODE STATUS:    TOTAL TIME TAKING CARE OF THIS PATIENT: 25 minutes    Dustin Flock M.D on 03/13/2015 at 6:33 PM  Between 7am to 6pm - Pager - [phone]  After 6pm go to [email or web address] - password [email or web address] [facility]  [facility]  Office  [phone]  CC: Primary care physician; No primary care provider on file.

## 2015-03-13 NOTE — ED Provider Notes (Signed)
Medical screening examination/treatment/procedure(s) were conducted as a shared visit with non-physician practitioner(s) and myself.  I personally evaluated the patient during the encounter.  Personally examined the patient. He was initially given 1 L of normal saline, we have stopped second liter noting sodium 118. Discussed with the patient, we'll admit him to [facility], he refuses transfer back to the a [facility] in [place]. Completed refusal papers.  Delman Kitten, MD 03/13/15 [phone]

## 2015-03-13 NOTE — ED Notes (Signed)
Pt states nasal congestion, cough, chills for about 1 week

## 2015-03-13 NOTE — ED Provider Notes (Signed)
[facility] Emergency Department Provider Note ____________________________________________  Time seen: Approximately 2:37 PM  I have reviewed the triage vital signs and the nursing notes.   HISTORY  Chief Complaint Headache; Chills; and Generalized Body Aches  HPI Carl Ortega is a 64 y.o. male who presents to the emergency department for evaluation of cough, chills, fever, and nasal congestion for the past week. He states he is homeless and is trying to make it to the [place]. He has recently been released from the [facility] after being admitted for depression, PTSD, and old TBI. He tells me he has severe body aches and feels very bad in general. His cough is productive. Subjective fever. He is not taking any medications for his symptoms.   Past Medical History  Diagnosis Date  . Hypertension   . Bipolar 1 disorder ([place])   . PTSD (post-traumatic stress disorder)   . Brain injury [facility])     Patient Active Problem List   Diagnosis Date Noted  . Hyponatremia 03/13/2015  . PTSD (post-traumatic stress disorder) 12/03/2014  . Hypertension 12/03/2014  . Cocaine abuse 12/03/2014  . 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 None      Current Outpatient Rx  Name  Route  Sig  Dispense  Refill  . amLODipine (NORVASC) 10 MG tablet   Oral   Take 10 mg by mouth daily.         Marland Kitchen aspirin EC 81 MG tablet   Oral   Take 81 mg by mouth daily.         . Calcium Carbonate-Vitamin D (CALCIUM 600+D) 600-400 MG-UNIT tablet   Oral   Take 2 tablets by mouth daily.         . carbamazepine (TEGRETOL) 200 MG tablet   Oral   Take 400 mg by mouth 2 (two) times daily.         . Cholecalciferol 1000 UNITS TBDP   Oral   Take 2,000 Units by mouth daily.         Marland Kitchen lisinopril-hydrochlorothiazide (PRINZIDE,ZESTORETIC) 20-25 MG tablet   Oral   Take 2 tablets by mouth daily.         Marland Kitchen  loratadine (CLARITIN) 10 MG tablet   Oral   Take 10 mg by mouth daily as needed for allergies.         . Melatonin 5 MG TABS   Oral   Take 5 mg by mouth at bedtime.         . Multiple Vitamins-Minerals (MULTIVITAMIN WITH MINERALS) tablet   Oral   Take 1 tablet by mouth daily.         . Omega-3 Fatty Acids (FISH OIL) 1000 MG CAPS   Oral   Take 2,000 mg by mouth 2 (two) times daily.         Marland Kitchen omeprazole (PRILOSEC) 20 MG capsule   Oral   Take 20 mg by mouth daily.         . propranolol ER (INDERAL LA) 80 MG 24 hr capsule   Oral   Take 80 mg by mouth daily.         . sennosides-docusate sodium (SENOKOT-S) 8.6-50 MG tablet   Oral   Take 1 tablet by mouth 2 (two) times daily.         . vitamin B-12 (CYANOCOBALAMIN) 1000 MCG tablet   Oral   Take 1,000 mcg by mouth  daily.           Allergies Ketorolac  Family History  Problem Relation Age of Onset  . Hypertension      Social History Social History  Substance Use Topics  . Smoking status: Current Every Day Smoker  . Smokeless tobacco: None  . Alcohol Use: 2.4 oz/week    4 Standard drinks or equivalent per week    Review of Systems Constitutional: Positive for  fever/chills Eyes: No visual changes. ENT: No sore throat. Cardiovascular: Denies chest pain. Respiratory: Positive for shortness of breath. Positive for cough. Gastrointestinal: Negative for abdominal pain. Positive for nausea,  Positive for vomiting.  Positive for diarrhea.  Genitourinary: Negative for dysuria. Musculoskeletal: Positive  for body aches Skin: Negative for rash. Neurological: Positive for headaches, Negative for focal weakness or numbness.  10-point ROS otherwise negative.  ____________________________________________   PHYSICAL EXAM:  VITAL SIGNS: ED Triage Vitals  Enc Vitals Group     BP 03/13/15 1327 141/85 mmHg     Pulse Rate 03/13/15 1327 78     Resp 03/13/15 1327 18     Temp 03/13/15 1327 97.6 F (36.4 C)      Temp Source 03/13/15 1327 Oral     SpO2 03/13/15 1327 100 %     Weight 03/13/15 1327 160 lb (72.576 kg)     Height 03/13/15 1327 5\' 9"  (1.753 m)     Head Cir --      Peak Flow --      Pain Score 03/13/15 1333 10     Pain Loc --      Pain Edu? --      Excl. in [place]? --     Constitutional: Alert and oriented. Ill appearing and in no acute distress. Eyes: Conjunctivae are normal. PERRL. EOMI. Ears: TM normal. Head: Atraumatic. Nose: No congestion/rhinnorhea. Mouth/Throat: Mucous membranes are moist.  Oropharynx non-erythematous. Neck: No stridor.  Lymphatic: No cervical lymphadenopathy. Cardiovascular: Normal rate, regular rhythm. Grossly normal heart sounds.  Good peripheral circulation. Respiratory: Normal respiratory effort.  No retractions. Lungs diminished throughout. Gastrointestinal: Soft and nontender. No distention. No abdominal bruits. No CVA tenderness. Musculoskeletal: No joint pain reported. Neurologic:  Normal speech and language. No gross focal neurologic deficits are appreciated. Speech is normal. No gait instability. Disoriented to date. Oriented to person and place. Skin:  Skin is warm, dry and intact. No rash noted. Psychiatric: Mood and affect are normal. Speech and behavior are normal.  ____________________________________________   LABS (all labs ordered are listed, but only abnormal results are displayed)  Labs Reviewed  CBC WITH DIFFERENTIAL/PLATELET - Abnormal; Notable for the following:    HCT 38.9 (*)    Monocytes Absolute 1.3 (*)    All other components within normal limits  COMPREHENSIVE METABOLIC PANEL - Abnormal; Notable for the following:    Sodium 118 (*)    Potassium 3.0 (*)    Chloride 83 (*)    Glucose, Bld 112 (*)    All other components within normal limits  COMPREHENSIVE METABOLIC PANEL - Abnormal; Notable for the following:    Sodium 121 (*)    Potassium 3.4 (*)    Chloride 88 (*)    Glucose, Bld 107 (*)    All other components  within normal limits  CK  TROPONIN I  URINALYSIS COMPLETEWITH MICROSCOPIC ([facility] ONLY)  OSMOLALITY  OSMOLALITY, URINE   ____________________________________________  EKG  NSR with rate of  ____________________________________________  RADIOLOGY  Chest X-ray with rounded density over the  cardiac apex on the lateral view. Lung mass is not excluded.  ____________________________________________   PROCEDURES  Procedure(s) performed: None  Critical Care performed: No  ____________________________________________   INITIAL IMPRESSION / ASSESSMENT AND PLAN / ED COURSE  Pertinent labs & imaging results that were available during my care of the patient were reviewed by me and considered in my medical decision making (see chart for details).  Hypokalemic and hyponatremic on CMP. NS and KDur ordered. Will move patient to front for cardiac monitoring when bed is available. Awaiting CT chest with contrast for better evaluation of the mass noted on the chest film.   1800: Patient to be admitted. He agrees to treatment and does not wish to be transferred back to the [place].   1815: Dr. Posey Pronto accepted patient for admission. Patient resting and awaiting room assignment. ____________________________________________   FINAL CLINICAL IMPRESSION(S) / ED DIAGNOSES  Final diagnoses:  Hyponatremia  Hypokalemia      Victorino Dike, FNP 03/13/15 1953  Carrie Mew, MD 03/13/15 2209

## 2015-03-13 NOTE — ED Notes (Signed)
Attempted to call report, charge nurse receiving nurse and unable to take report at this time.  Phone number of this nurse given for receiving nurse to call back.

## 2015-03-14 LAB — BASIC METABOLIC PANEL
Anion gap: 8 (ref 5–15)
BUN: 12 mg/dL (ref 6–20)
CALCIUM: 8.8 mg/dL — AB (ref 8.9–10.3)
CHLORIDE: 93 mmol/L — AB (ref 101–111)
CO2: 25 mmol/L (ref 22–32)
CREATININE: 1.32 mg/dL — AB (ref 0.61–1.24)
GFR calc Af Amer: >60 mL/min (ref >60)
GFR calc non Af Amer: 55 mL/min — ABNORMAL LOW (ref >60)
Glucose, Bld: 113 mg/dL — ABNORMAL HIGH (ref 65–99)
Potassium: 3.1 mmol/L — ABNORMAL LOW (ref 3.5–5.1)
Sodium: 126 mmol/L — ABNORMAL LOW (ref 135–145)

## 2015-03-14 LAB — URINALYSIS COMPLETE WITH MICROSCOPIC (ARMC ONLY)
BACTERIA UA: NONE SEEN
BILIRUBIN URINE: NEGATIVE
GLUCOSE, UA: NEGATIVE mg/dL
HGB URINE DIPSTICK: NEGATIVE
Ketones, ur: NEGATIVE mg/dL
Leukocytes, UA: NEGATIVE
NITRITE: NEGATIVE
Protein, ur: NEGATIVE mg/dL
RBC / HPF: NONE SEEN RBC/hpf (ref 0–5)
Specific Gravity, Urine: 1.005 (ref 1.005–1.030)
Squamous Epithelial / LPF: NONE SEEN
pH: 7 (ref 5.0–8.0)

## 2015-03-14 LAB — CBC
HCT: 34.8 % — ABNORMAL LOW (ref 40.0–52.0)
Hemoglobin: 12.3 g/dL — ABNORMAL LOW (ref 13.0–18.0)
MCH: 31.4 pg (ref 26.0–34.0)
MCHC: 35.2 g/dL (ref 32.0–36.0)
MCV: 89.2 fL (ref 80.0–100.0)
PLATELETS: 203 10*3/uL (ref 150–440)
RBC: 3.9 MIL/uL — ABNORMAL LOW (ref 4.40–5.90)
RDW: 12.7 % (ref 11.5–14.5)
WBC: 6.9 10*3/uL (ref 3.8–10.6)

## 2015-03-14 LAB — SODIUM: Sodium: 129 mmol/L — ABNORMAL LOW (ref 135–145)

## 2015-03-14 LAB — OSMOLALITY, URINE: Osmolality, Ur: 108 mOsm/kg — ABNORMAL LOW (ref 300–900)

## 2015-03-14 MED ORDER — MELATONIN 5 MG PO TABS: 5.0000 mg | ORAL_TABLET | Freq: Every day | ORAL | Status: DC

## 2015-03-14 MED ORDER — CALCIUM CARBONATE-VITAMIN D 600-400 MG-UNIT PO TABS: 2.0000 | ORAL_TABLET | Freq: Every day | ORAL | Status: DC

## 2015-03-14 MED ORDER — ADULT MULTIVITAMIN W/MINERALS CH
1.0000 | ORAL_TABLET | Freq: Every day | ORAL | Status: DC
Start: 2015-03-14 — End: 2015-03-15
  Administered 2015-03-14 – 2015-03-15 (×2): 1 via ORAL
  Filled 2015-03-14 (×2): qty 1

## 2015-03-14 MED ORDER — CALCIUM CARBONATE-VITAMIN D 500-200 MG-UNIT PO TABS
2.0000 | ORAL_TABLET | Freq: Every day | ORAL | Status: DC
  Administered 2015-03-14 – 2015-03-15 (×2): 2 via ORAL
  Filled 2015-03-14 (×2): qty 2

## 2015-03-14 MED ORDER — CARBAMAZEPINE 200 MG PO TABS
400.0000 mg | ORAL_TABLET | Freq: Two times a day (BID) | ORAL | Status: DC
  Administered 2015-03-14 – 2015-03-15 (×3): 400 mg via ORAL
  Filled 2015-03-14 (×3): qty 2

## 2015-03-14 MED ORDER — VITAMIN B-12 1000 MCG PO TABS
1000.0000 ug | ORAL_TABLET | Freq: Every day | ORAL | Status: DC
  Administered 2015-03-14 – 2015-03-15 (×2): 1000 ug via ORAL
  Filled 2015-03-14 (×2): qty 1

## 2015-03-14 MED ORDER — VITAMIN D 1000 UNITS PO TABS
2000.0000 [IU] | ORAL_TABLET | Freq: Every day | ORAL | Status: DC
  Administered 2015-03-14 – 2015-03-15 (×2): 2000 [IU] via ORAL
  Filled 2015-03-14 (×2): qty 2

## 2015-03-14 MED ORDER — ASPIRIN EC 81 MG PO TBEC
81.0000 mg | DELAYED_RELEASE_TABLET | Freq: Every day | ORAL | Status: DC
  Administered 2015-03-14 – 2015-03-15 (×2): 81 mg via ORAL
  Filled 2015-03-14 (×2): qty 1

## 2015-03-14 MED ORDER — LORATADINE 10 MG PO TABS: 10.0000 mg | ORAL_TABLET | Freq: Every day | ORAL | Status: DC | PRN

## 2015-03-14 MED ORDER — PROPRANOLOL HCL ER 80 MG PO CP24
80.0000 mg | ORAL_CAPSULE | Freq: Every day | ORAL | Status: DC
  Administered 2015-03-15: 80 mg via ORAL
  Filled 2015-03-14 (×2): qty 1

## 2015-03-14 MED ORDER — SODIUM CHLORIDE 0.9 % IV BOLUS (SEPSIS)
1000.0000 mL | Freq: Once | INTRAVENOUS | Status: AC
  Administered 2015-03-14: 1000 mL via INTRAVENOUS

## 2015-03-14 MED ORDER — PANTOPRAZOLE SODIUM 40 MG PO TBEC
40.0000 mg | DELAYED_RELEASE_TABLET | Freq: Every day | ORAL | Status: DC
  Administered 2015-03-14 – 2015-03-15 (×2): 40 mg via ORAL
  Filled 2015-03-14 (×2): qty 1

## 2015-03-14 MED ORDER — AMLODIPINE BESYLATE 10 MG PO TABS
10.0000 mg | ORAL_TABLET | Freq: Every day | ORAL | Status: DC
  Administered 2015-03-15: 10 mg via ORAL
  Filled 2015-03-14: qty 1

## 2015-03-14 MED ORDER — SENNOSIDES-DOCUSATE SODIUM 8.6-50 MG PO TABS
1.0000 | ORAL_TABLET | Freq: Two times a day (BID) | ORAL | Status: DC
Start: 2015-03-14 — End: 2015-03-15
  Administered 2015-03-14 – 2015-03-15 (×3): 1 via ORAL
  Filled 2015-03-14 (×3): qty 1

## 2015-03-14 MED ORDER — SODIUM CHLORIDE 0.9 % IV BOLUS (SEPSIS)
1000.0000 mL | INTRAVENOUS | Status: DC | PRN
  Administered 2015-03-14: 1000 mL via INTRAVENOUS
  Filled 2015-03-14: qty 1000

## 2015-03-14 MED ORDER — PNEUMOCOCCAL VAC POLYVALENT 25 MCG/0.5ML IJ INJ
0.5000 mL | INJECTION | INTRAMUSCULAR | Status: AC
  Administered 2015-03-15: 0.5 mL via INTRAMUSCULAR
  Filled 2015-03-14: qty 0.5

## 2015-03-14 NOTE — Care Management Note (Signed)
Case Management Note  Patient Details  Name: Carl Ortega MRN: [id number] Date of Birth: [birth year]  Subjective/Objective:   Carl Ortega told [employer], CSW,  that he was suicidal and homicidal this morning, and that he wants to go back to the [facility] from which he was discharged 1 month ago from their Psych Unit. Dr Fritzi Mandes was updated by this writer. Carl Orte[facility] to Fax: [phone] (weekend fax). Carl Ortega reports that he is homeless and has been staying at the [facility] on [address] in [place]. Carl Ortega has apparently been off his meds for HTN, Bipolar DO, and PTSD. He reports a long standing traumatic brain injury (TBI) with memory loss.                Action/Plan:   Expected Discharge Date:  03/15/15               Expected Discharge Plan:     In-House Referral:     Discharge planning Services     Post Acute Care Choice:    Choice offered to:     DME Arranged:    DME Agency:     HH Arranged:    [place] Agency:     Status of Service:     Medicare Important Message Given:    Date Medicare IM Given:    Medicare IM give by:    Date Additional Medicare IM Given:    Additional Medicare Important Message give by:     If discussed at [place] of Stay Meetings, dates discussed:    Additional Comments:  Lyndi Holbein A, RN 03/14/2015, 12:51 PM

## 2015-03-14 NOTE — Progress Notes (Signed)
   03/14/15 1110  Clinical Encounter Type  Visited With Patient  Visit Type Initial  Referral From Nurse  Consult/Referral To Chaplain  Spiritual Encounters  Spiritual Needs Prayer;Emotional  Provided pastoral presence, support and prayer to patient.  [place] [phone]

## 2015-03-14 NOTE — Progress Notes (Signed)
Pt denies any suicidal thoughts. MD Posey Pronto on the phone and aware. Will continue to monitor.

## 2015-03-14 NOTE — Progress Notes (Signed)
PHARMACIST - PHYSICIAN ORDER COMMUNICATION  CONCERNING: P&T Medication Policy on Herbal Medications  DESCRIPTION:  This patient's order for:  melatonin  has been noted.  This product(s) is classified as an "herbal" or natural product. Due to a lack of definitive safety studies or FDA approval, nonstandard manufacturing practices, plus the potential risk of unknown drug-drug interactions while on inpatient medications, the Pharmacy and Therapeutics Committee does not permit the use of "herbal" or natural products of this type within [place].   ACTION TAKEN: The pharmacy department is unable to verify this order at this time. Please reevaluate patient's clinical condition at discharge and address if the herbal or natural product(s) should be resumed at that time.   

## 2015-03-14 NOTE — Progress Notes (Signed)
Blood Pressure 84/53. Dr Marcille Blanco notified. 1000CC NS bolus ordered

## 2015-03-14 NOTE — Progress Notes (Addendum)
Pt admitted with flu like symptoms. Pt is alert and oriented  Blood pressure low. MD ordered bolus x2

## 2015-03-14 NOTE — Progress Notes (Signed)
Notified Dr Lavetta Nielsen to order medications on med list

## 2015-03-14 NOTE — Clinical Social Work Note (Deleted)
LCSW met with patient. He reported his has a brain injury. Nurses attending stated they have not engaged because he was asleep for over a day. He was pleasant for assessment but indicated he is suicidal and homicidal at this time and wants to return to [place].Other medical LCSW and care Manager Mervin Kung was notified. He wants to go to [facility] and remembered his psychiatrist Dr Junie Panning (sp) at [facility]. He answered assessment questions but struggled to answer he stated through out session things he remembered and then stated things he cant remember . In observation he re-organized his food tray several times but was unable to figure out which utensil to use he did not eat the food but rather looked at it and repeatedly re-organized the bowl and saucer and plate. ( Memory issues)

## 2015-03-14 NOTE — Progress Notes (Signed)
Dr Marcille Blanco notified of low blood pressure 58/38. 1000cc NS bolus ordered.

## 2015-03-14 NOTE — Progress Notes (Signed)
[place] [facility] [address] NAME: Carl Ortega    MR#:  [id number]  DATE OF BIRTH:  November 13, 1950  SUBJECTIVE:   Came in with generalized weakness not feeling well. Sodium of 118. Patient was just released from [place] to her arm above a week to 10 days ago. He is a poor historian. Unable to give any details about his admission other than having low-sodium there. Blood pressure initially was low received about 3 L of fluids. Currently able to tolerate by mouth diet. His straight denying any suicidal ideation or intentions to harm himself. REVIEW OF SYSTEMS:   Review of Systems  Constitutional: Negative for fever, chills and weight loss.  HENT: Negative for ear discharge, ear pain and nosebleeds.   Eyes: Negative for blurred vision, pain and discharge.  Respiratory: Negative for sputum production, shortness of breath, wheezing and stridor.   Cardiovascular: Negative for chest pain, palpitations, orthopnea and PND.  Gastrointestinal: Negative for nausea, vomiting, abdominal pain and diarrhea.  Genitourinary: Negative for urgency and frequency.  Musculoskeletal: Positive for myalgias. Negative for back pain and joint pain.  Neurological: Positive for weakness. Negative for sensory change, speech change and focal weakness.  Psychiatric/Behavioral: Negative for depression and hallucinations. The patient is not nervous/anxious.   All other systems reviewed and are negative.  Tolerating Diet: Yes Tolerating PT: Pending  DRUG ALLERGIES:   Allergies  Allergen Reactions  . Ketorolac Rash    VITALS:  Blood pressure 105/60, pulse 63, temperature 97.5 F (36.4 C), temperature source Oral, resp. rate 18, height 5\' 9"  (1.753 m), weight 72.576 kg (160 lb), SpO2 96 %.  PHYSICAL EXAMINATION:   Physical Exam  GENERAL:  64 y.o.-year-old patient lying in the bed with no acute distress.  EYES: Pupils equal, round, reactive to light and accommodation. No scleral  icterus. Extraocular muscles intact.  HEENT: Head atraumatic, normocephalic. Oropharynx and nasopharynx clear.  NECK:  Supple, no jugular venous distention. No thyroid enlargement, no tenderness.  LUNGS: Normal breath sounds bilaterally, no wheezing, rales, rhonchi. No use of accessory muscles of respiration.  CARDIOVASCULAR: S1, S2 normal. No murmurs, rubs, or gallops.  ABDOMEN: Soft, nontender, nondistended. Bowel sounds present. No organomegaly or mass.  EXTREMITIES: No cyanosis, clubbing or edema b/l.    NEUROLOGIC: Cranial nerves II through XII are intact. No focal Motor or sensory deficits b/l.   PSYCHIATRIC: The patient is alert and oriented x 3.  SKIN: No obvious rash, lesion, or ulcer.    LABORATORY PANEL:   CBC  Recent Labs Lab 03/14/15 0253  WBC 6.9  HGB 12.3*  HCT 34.8*  PLT 203    Chemistries   Recent Labs Lab 03/13/15 1913 03/14/15 0253 03/14/15 0809  NA 121* 126* 129*  K 3.4* 3.1*  --   CL 88* 93*  --   CO2 28 25  --   GLUCOSE 107* 113*  --   BUN 10 12  --   CREATININE 1.14 1.32*  --   CALCIUM 8.9 8.8*  --   AST 29  --   --   ALT 24  --   --   ALKPHOS 62  --   --   BILITOT 0.9  --   --     Cardiac Enzymes  Recent Labs Lab 03/13/15 1531  TROPONINI <0.03    RADIOLOGY:  Dg Chest 2 View  03/13/2015  CLINICAL DATA:  Cough, congestion, chills, and weakness for 1 week. EXAM: CHEST  2 VIEW  COMPARISON:  None. FINDINGS: Rounded density projects over the cardiac apex on the lateral view. Increased AP diameter of the chest. No pneumothorax or pleural effusion. Normal heart size. IMPRESSION: Rounded density projects over the cardiac apex on the lateral view. Lung mass is not excluded. Comparison with prior radiographs is recommended. If none are available, CT can be performed. Electronically Signed   By: Marybelle Killings M.D.   On: 03/13/2015 15:00   Ct Chest W Contrast  03/13/2015  CLINICAL DATA:  Dementia, nasal congestion, cough and chills for 1 week,  abnormal chest x-ray EXAM: CT CHEST WITH CONTRAST TECHNIQUE: Multidetector CT imaging of the chest was performed during intravenous contrast administration. Sagittal and coronal MPR images reconstructed from axial data set. CONTRAST:  60mL OMNIPAQUE IOHEXOL 300 MG/ML  SOLN IV COMPARISON:  None; correlation chest radiograph 03/13/2015 FINDINGS: Thoracic vascular structures patent on nondedicated exam. Atherosclerotic calcification aortic arch and coronary arteries. No thoracic adenopathy. Visualized upper abdomen unremarkable. Lungs clear. No pulmonary infiltrate, pleural effusion, pneumothorax or mass/ nodule. Specifically no anterior lung base mass or opacity is identified to account for radiographic findings. Suspect radiographic abnormality was related to superimposed chest wall structures. No acute osseous findings. IMPRESSION: Minimal atherosclerotic calcification aorta and coronary arteries. Otherwise normal CT chest. Electronically Signed   By: Lavonia Dana M.D.   On: 03/13/2015 17:34     ASSESSMENT AND PLAN:   64 year old white male presented with body aches weakness cough congestion noted to have hyponatremia severe and hypokalemia  1. Hyponatremia- could be related to dehydration, he denies excessive fluid ingestion. Also not sure what medication he is on and he is not aware of what medications he takes. - continue IV fluids  - sodium improving came in with 118 ----129 - hold off on hydrochlorothiazide.   2. Bipolar disorder and PTSD - we'll resume his by mouth home meds which he brought it along with him. Consider psych consult if needed.  - patient denies any suicidal ideation or intention to harm himself. This was discussed with him and presence of RN.   3.  related to hypotension earlier  History of Hypertension blood pressure currently normal we'll resume his home medication once available  continue lisinopril from tomorrow DC hydrochlorothiazide  4. Nicotine addiction smoking  cessation provided for minutes spent patient will be started on nicotine patch strongly recommended he stop smoking  Care management for discharge planning once sodium improves we'll plan on discharging him.  Case discussed with Care Management/Social Worker. Management plans discussed with the patient, family and they are in agreement.  CODE STATUS: Full   DVT Prophylaxis: Lovenox   TOTAL TIME TAKING CARE OF THIS PATIENT: 35  minutes.  >50% time spent on counselling and coordination of care  POSSIBLE D/C IN 1-2 DAYS, DEPENDING ON CLINICAL CONDITION.   Elzada Pytel M.D on 03/14/2015 at 12:50 PM  Between 7am to 6pm - Pager - [phone]  After 6pm go to [email or web address] - password [email or web address] [facility]  [facility]  Office  [phone]  CC: Primary care physician; No primary care provider on file.

## 2015-03-14 NOTE — Care Management Note (Signed)
Case Management Note  Patient Details  Name: Carl Ortega MRN: [id number] Date of Birth: 07-07-1950  Subjective/Objective:        [name] from the [facility] called to report that they have received the referral. The [facility] is currently on diversion but they will put Carl Ortega on the list for admission review when a bed becomes available.             Action/Plan:   Expected Discharge Date:  03/15/15               Expected Discharge Plan:     In-House Referral:     Discharge planning Services     Post Acute Care Choice:    Choice offered to:     DME Arranged:    DME Agency:     HH Arranged:    [place] Agency:     Status of Service:     Medicare Important Message Given:    Date Medicare IM Given:    Medicare IM give by:    Date Additional Medicare IM Given:    Additional Medicare Important Message give by:     If discussed at [place] of Stay Meetings, dates discussed:    Additional Comments:  [name], RN 03/14/2015, 1:05 PM
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
## 2015-03-15 DIAGNOSIS — E871 Hypo-osmolality and hyponatremia: Secondary | ICD-10-CM | POA: Diagnosis not present

## 2015-03-15 LAB — BASIC METABOLIC PANEL
Anion gap: 6 (ref 5–15)
BUN: 9 mg/dL (ref 6–20)
CALCIUM: 8.5 mg/dL — AB (ref 8.9–10.3)
CHLORIDE: 104 mmol/L (ref 101–111)
CO2: 25 mmol/L (ref 22–32)
CREATININE: 1.01 mg/dL (ref 0.61–1.24)
Glucose, Bld: 102 mg/dL — ABNORMAL HIGH (ref 65–99)
Potassium: 3.6 mmol/L (ref 3.5–5.1)
Sodium: 135 mmol/L (ref 135–145)

## 2015-03-15 MED ORDER — ZOLPIDEM TARTRATE 5 MG PO TABS: 5.0000 mg | ORAL_TABLET | Freq: Every evening | ORAL | Status: DC | PRN

## 2015-03-15 MED ORDER — LISINOPRIL 20 MG PO TABS: 20.0000 mg | ORAL_TABLET | Freq: Every day | ORAL | Status: DC

## 2015-03-15 MED ORDER — ZOLPIDEM TARTRATE 5 MG PO TABS
5.0000 mg | ORAL_TABLET | Freq: Once | ORAL | Status: AC
  Administered 2015-03-15: 5 mg via ORAL
  Filled 2015-03-15: qty 1

## 2015-03-15 NOTE — Clinical Social Work Note (Signed)
Pt reported that he was from the local homeless shelter and was unable to return at discharge. Pt reported that he was told by a Education officer, museum at the [place] that he could not return as he made too much money. With pt's permission,  CSW called shelter and shelter is able to accept pt for an assessment. Pt is not known to [facility] [employer], per Tourist information centre manager. Pt could not give times that he has spent the night at the shelter and has attended for food.   Pt is a poor historian is unclear of how he came to [place], [place] and stated that he worked in [place], [place] and went to the [place] in [place], [place] for services as well as the [place] in [place], [place].   [place] provided a taxi vouchure and a bus pass (for future need). Pt has been instructed to go to [facility] for assessment. Pt has been provided with a list of [employer]. Pt is agreeable to discharge plan. CSW reviewed discharge plan with pt several times. CSW updated RN of discharge plan. CSW also updated AC. CSW is signing off as no further needs identified.   Darden Dates, MSW, LCSW Clinical Social Worker [phone]

## 2015-03-15 NOTE — Progress Notes (Signed)
Pt. Unable to sleep. Ambien 5mg  PO HS ordered

## 2015-03-15 NOTE — Care Management Note (Addendum)
Case Management Note  Patient Details  Name: Carl Ortega MRN: [id number] Date of Birth: 01/10/1951  Subjective/Objective:   [name] resides at the [facility] shelter on [address] in [place]. This Probation officer notified [place] that [name] is being discharged today. Referral was made to the [facility] yesterday because [name] has VA benefits and was discharged from the [facility] about a month ago. The [facility] called yesterday to notify this writer that they are on Diversion status at this time.   Attempted to provide [name] with a [employer] bus ticket back to the homeless shelter in [place]. [name] reports that he was told by shelter staff that he cannot return there. Judson Roch CSW was updated and will address this discharge disposition.               Action/Plan:   Expected Discharge Date:  03/15/15               Expected Discharge Plan:     In-House Referral:     Discharge planning Services     Post Acute Care Choice:    Choice offered to:     DME Arranged:    DME Agency:     HH Arranged:    [place] Agency:     Status of Service:     Medicare Important Message Given:  Yes Date Medicare IM Given:    Medicare IM give by:    Date Additional Medicare IM Given:    Additional Medicare Important Message give by:     If discussed at [place] of Stay Meetings, dates discussed:    Additional Comments:  Steffon Gladu A, RN 03/15/2015, 10:41 AM

## 2015-03-15 NOTE — Progress Notes (Signed)
Initial Nutrition Assessment   INTERVENTION:   Meals and Snacks: Cater to patient preferences Medical Food Supplement Therapy: will send on follow if intake poor   NUTRITION DIAGNOSIS:   Predicted suboptimal nutrient intake related to social/environmental circumstances as evidenced by per pt/family report.   GOAL:   Patient will meet greater than or equal to 90% of their needs  MONITOR:    (Energy Intake, Electrolyte and Renal Profile, Anthropometrics)  REASON FOR ASSESSMENT:   Malnutrition Screening Tool    ASSESSMENT:   Pt admitted with hyponatremia and hypokalemia.  Past Medical History  Diagnosis Date  . Hypertension   . Bipolar 1 disorder ([place])   . PTSD (post-traumatic stress disorder)   . Brain injury [facility])     Diet Order:  Diet regular Room service appropriate?: Yes; Fluid consistency:: Thin    Current Nutrition: Pt ate 100% of breakfast this am on visit. Recorded po intake 100% of meals yesterday as well.  Food/Nutrition-Related History: Pt reports poor po intake PTA secondary to access to food. Unable to clarify much further on visit, as pt reports not remembering his nutrition history secondary to TBI. Per pt he was trying to get a dorm at a [place] facility. RD notes recently hospitalized at psych facility of [facility] hospital in [place].    Scheduled Medications:  . amLODipine  10 mg Oral Daily  . aspirin EC  81 mg Oral Daily  . calcium-vitamin D  2 tablet Oral Daily  . carbamazepine  400 mg Oral BID  . cholecalciferol  2,000 Units Oral Daily  . guaiFENesin  600 mg Oral BID  . heparin  5,000 Units Subcutaneous 3 times per day  . multivitamin with minerals  1 tablet Oral Daily  . pantoprazole  40 mg Oral Daily  . propranolol ER  80 mg Oral Daily  . senna-docusate  1 tablet Oral BID  . vitamin B-12  1,000 mcg Oral Daily    Continuous Medications:  . sodium chloride 75 mL/hr at 03/14/15 2214     Electrolyte/Renal Profile and Glucose Profile:   Recent  Labs Lab 03/13/15 1913 03/14/15 0253 03/14/15 0809 03/15/15 0353  NA 121* 126* 129* 135  K 3.4* 3.1*  --  3.6  CL 88* 93*  --  104  CO2 28 25  --  25  BUN 10 12  --  9  CREATININE 1.14 1.32*  --  1.01  CALCIUM 8.9 8.8*  --  8.5*  GLUCOSE 107* 113*  --  102*   Protein Profile:  Recent Labs Lab 03/13/15 1531 03/13/15 1913  ALBUMIN 4.4 4.1    Gastrointestinal Profile: Last BM:  03/14/2015   Weight Change: Unable to clarify weight trend. Per [facility] weight 160lbs 3 months ago, could be stated weight.   Height:   Ht Readings from Last 1 Encounters:  03/13/15 5\' 9"  (1.753 m)    Weight:   Wt Readings from Last 1 Encounters:  03/13/15 160 lb (72.576 kg)    BMI:  Body mass index is 23.62 kg/(m^2).   EDUCATION NEEDS:   No education needs identified at this time   [place], [place], LDN Pager [phone]

## 2015-03-15 NOTE — Care Management Important Message (Signed)
Important Message  Patient Details  Name: Carl Ortega MRN: [id number] Date of Birth: [birth year]   Medicare Important Message Given:  Yes    Liridona Mashaw A, RN 03/15/2015, 7:54 AM

## 2015-03-15 NOTE — Discharge Summary (Signed)
[facility] [place] NAME: Carl Ortega    MR#:  [id number]  DATE OF BIRTH:  27-Jul-1950  DATE OF ADMISSION:  03/13/2015 ADMITTING PHYSICIAN: Dustin Flock, MD  DATE OF DISCHARGE: 03/15/15  PRIMARY CARE PHYSICIAN: No primary care provider on file.    ADMISSION DIAGNOSIS:  Hypokalemia [E87.6] Hyponatremia [E87.1]  DISCHARGE DIAGNOSIS:  Hyponatremia due to dehydraiton and po HCTZ H/o traumatic brain injury H/o PTSD SECONDARY DIAGNOSIS:   Past Medical History  Diagnosis Date  . Hypertension   . Bipolar 1 disorder ([place])   . PTSD (post-traumatic stress disorder)   . Brain injury [facility])     HOSPITAL COURSE:   64 year old white male presented with body aches weakness cough congestion noted to have hyponatremia severe and hypokalemia  1. Hyponatremia- could be related to dehydration, he denies excessive fluid ingestion. Also not sure what medication he is on and he is not aware of what medications he takes. - received IV fluids  - sodium improving came in with 118 ----129---136 - hold off on hydrochlorothiazide.  -eating well  2. Bipolar disorder and PTSD - we'll resume his by mouth home meds which he brought it along with him. Consider psych consult if needed.  - patient denies any suicidal ideation or intention to harm himself. This was discussed with him and presence of RN.   3. related to hypotension earlier  History of Hypertension blood pressure currently normal we'll resume his home medication once available continue lisinopril from today DC hydrochlorothiazide  4. Nicotine addiction smoking cessation provided for minutes spent patient will be started on nicotine patch strongly recommended he stop smoking  Pt is medically stable for d/c D/w Cm and working with d/c plans   DRUG ALLERGIES:   Allergies  Allergen Reactions  . Ketorolac Rash    DISCHARGE MEDICATIONS:   Current Discharge Medication List     START taking these medications   Details  lisinopril (PRINIVIL,ZESTRIL) 20 MG tablet Take 1 tablet (20 mg total) by mouth daily. Qty: 30 tablet, Refills: 2      CONTINUE these medications which have NOT CHANGED   Details  amLODipine (NORVASC) 10 MG tablet Take 10 mg by mouth daily.    aspirin EC 81 MG tablet Take 81 mg by mouth daily.    Calcium Carbonate-Vitamin D (CALCIUM 600+D) 600-400 MG-UNIT tablet Take 2 tablets by mouth daily.    carbamazepine (TEGRETOL) 200 MG tablet Take 400 mg by mouth 2 (two) times daily.    Cholecalciferol 1000 UNITS TBDP Take 2,000 Units by mouth daily.    loratadine (CLARITIN) 10 MG tablet Take 10 mg by mouth daily as needed for allergies.    Melatonin 5 MG TABS Take 5 mg by mouth at bedtime.    Multiple Vitamins-Minerals (MULTIVITAMIN WITH MINERALS) tablet Take 1 tablet by mouth daily.    Omega-3 Fatty Acids (FISH OIL) 1000 MG CAPS Take 2,000 mg by mouth 2 (two) times daily.    omeprazole (PRILOSEC) 20 MG capsule Take 20 mg by mouth daily.    propranolol ER (INDERAL LA) 80 MG 24 hr capsule Take 80 mg by mouth daily.    sennosides-docusate sodium (SENOKOT-S) 8.6-50 MG tablet Take 1 tablet by mouth 2 (two) times daily.    vitamin B-12 (CYANOCOBALAMIN) 1000 MCG tablet Take 1,000 mcg by mouth daily.      STOP taking these medications     lisinopril-hydrochlorothiazide (PRINZIDE,ZESTORETIC) 20-25 MG tablet  If you experience worsening of your admission symptoms, develop shortness of breath, life threatening emergency, suicidal or homicidal thoughts you must seek medical attention immediately by calling 911 or calling your MD immediately  if symptoms less severe.  You Must read complete instructions/literature along with all the possible adverse reactions/side effects for all the Medicines you take and that have been prescribed to you. Take any new Medicines after you have completely understood and accept all the possible adverse  reactions/side effects.   Please note  You were cared for by a hospitalist during your hospital stay. If you have any questions about your discharge medications or the care you received while you were in the hospital after you are discharged, you can call the unit and asked to speak with the hospitalist on call if the hospitalist that took care of you is not available. Once you are discharged, your primary care physician will handle any further medical issues. Please note that NO REFILLS for any discharge medications will be authorized once you are discharged, as it is imperative that you return to your primary care physician (or establish a relationship with a primary care physician if you do not have one) for your aftercare needs so that they can reassess your need for medications and monitor your lab values. Today   SUBJECTIVE   Doing well. Eating po  VITAL SIGNS:  Blood pressure 157/81, pulse 69, temperature 97.9 F (36.6 C), temperature source Oral, resp. rate 18, height 5\' 9"  (1.753 m), weight 72.576 kg (160 lb), SpO2 100 %.  I/O:   Intake/Output Summary (Last 24 hours) at 03/15/15 1028 Last data filed at 03/15/15 0607  Gross per 24 hour  Intake   2985 ml  Output   2050 ml  Net    935 ml    PHYSICAL EXAMINATION:  GENERAL:  64 y.o.-year-old patient lying in the bed with no acute distress.  EYES: Pupils equal, round, reactive to light and accommodation. No scleral icterus. Extraocular muscles intact.  HEENT: Head atraumatic, normocephalic. Oropharynx and nasopharynx clear.  NECK:  Supple, no jugular venous distention. No thyroid enlargement, no tenderness.  LUNGS: Normal breath sounds bilaterally, no wheezing, rales,rhonchi or crepitation. No use of accessory muscles of respiration.  CARDIOVASCULAR: S1, S2 normal. No murmurs, rubs, or gallops.  ABDOMEN: Soft, non-tender, non-distended. Bowel sounds present. No organomegaly or mass.  EXTREMITIES: No pedal edema, cyanosis, or  clubbing.  NEUROLOGIC: Cranial nerves II through XII are intact. Muscle strength 5/5 in all extremities. Sensation intact. Gait not checked.  PSYCHIATRIC: The patient is alert and oriented x 3.  SKIN: No obvious rash, lesion, or ulcer.   DATA REVIEW:   CBC   Recent Labs Lab 03/14/15 0253  WBC 6.9  HGB 12.3*  HCT 34.8*  PLT 203    Chemistries   Recent Labs Lab 03/13/15 1913  03/15/15 0353  NA 121*  < > 135  K 3.4*  < > 3.6  CL 88*  < > 104  CO2 28  < > 25  GLUCOSE 107*  < > 102*  BUN 10  < > 9  CREATININE 1.14  < > 1.01  CALCIUM 8.9  < > 8.5*  AST 29  --   --   ALT 24  --   --   ALKPHOS 62  --   --   BILITOT 0.9  --   --   < > = values in this interval not displayed.  Microbiology Results   No  results found for this or any previous visit (from the past 240 hour(s)).  RADIOLOGY:  Dg Chest 2 View  03/13/2015  CLINICAL DATA:  Cough, congestion, chills, and weakness for 1 week. EXAM: CHEST  2 VIEW COMPARISON:  None. FINDINGS: Rounded density projects over the cardiac apex on the lateral view. Increased AP diameter of the chest. No pneumothorax or pleural effusion. Normal heart size. IMPRESSION: Rounded density projects over the cardiac apex on the lateral view. Lung mass is not excluded. Comparison with prior radiographs is recommended. If none are available, CT can be performed. Electronically Signed   By: Marybelle Killings M.D.   On: 03/13/2015 15:00   Ct Chest W Contrast  03/13/2015  CLINICAL DATA:  Dementia, nasal congestion, cough and chills for 1 week, abnormal chest x-ray EXAM: CT CHEST WITH CONTRAST TECHNIQUE: Multidetector CT imaging of the chest was performed during intravenous contrast administration. Sagittal and coronal MPR images reconstructed from axial data set. CONTRAST:  33mL OMNIPAQUE IOHEXOL 300 MG/ML  SOLN IV COMPARISON:  None; correlation chest radiograph 03/13/2015 FINDINGS: Thoracic vascular structures patent on nondedicated exam. Atherosclerotic  calcification aortic arch and coronary arteries. No thoracic adenopathy. Visualized upper abdomen unremarkable. Lungs clear. No pulmonary infiltrate, pleural effusion, pneumothorax or mass/ nodule. Specifically no anterior lung base mass or opacity is identified to account for radiographic findings. Suspect radiographic abnormality was related to superimposed chest wall structures. No acute osseous findings. IMPRESSION: Minimal atherosclerotic calcification aorta and coronary arteries. Otherwise normal CT chest. Electronically Signed   By: Lavonia Dana M.D.   On: 03/13/2015 17:34     Management plans discussed with the patient, family and they are in agreement.  CODE STATUS:     Code Status Orders        Start     Ordered   03/13/15 1958  Full code   Continuous     03/13/15 1957      TOTAL TIME TAKING CARE OF THIS PATIENT: 40 minutes.    Sarabella Caprio M.D on 03/15/2015 at 10:28 AM  Between 7am to 6pm - Pager - [phone] After 6pm go to [email or web address] - password [email or web address] [facility]  [place] [facility]  Office  [phone]  CC: Primary care physician; No primary care provider on file.

## 2015-04-01 ENCOUNTER — Encounter (HOSPITAL_COMMUNITY): Payer: Self-pay | Admitting: Neurology

## 2015-04-01 ENCOUNTER — Emergency Department (HOSPITAL_COMMUNITY)
Admission: EM | Admit: 2015-04-01 | Discharge: 2015-04-02 | Disposition: A | Discharge status: Home / Self Care | Payer: Medicare Other | Attending: Emergency Medicine | Admitting: Emergency Medicine

## 2015-04-01 DIAGNOSIS — Z87820 Personal history of traumatic brain injury: Secondary | ICD-10-CM | POA: Insufficient documentation

## 2015-04-01 DIAGNOSIS — Z7982 Long term (current) use of aspirin: Secondary | ICD-10-CM | POA: Insufficient documentation

## 2015-04-01 DIAGNOSIS — F319 Bipolar disorder, unspecified: Secondary | ICD-10-CM | POA: Insufficient documentation

## 2015-04-01 DIAGNOSIS — F1410 Cocaine abuse, uncomplicated: Secondary | ICD-10-CM | POA: Diagnosis not present

## 2015-04-01 DIAGNOSIS — Z5900 Homelessness unspecified: Secondary | ICD-10-CM

## 2015-04-01 DIAGNOSIS — R45850 Homicidal ideations: Secondary | ICD-10-CM | POA: Diagnosis present

## 2015-04-01 DIAGNOSIS — R45851 Suicidal ideations: Secondary | ICD-10-CM

## 2015-04-01 DIAGNOSIS — Z8639 Personal history of other endocrine, nutritional and metabolic disease: Secondary | ICD-10-CM | POA: Insufficient documentation

## 2015-04-01 DIAGNOSIS — Z79899 Other long term (current) drug therapy: Secondary | ICD-10-CM | POA: Insufficient documentation

## 2015-04-01 DIAGNOSIS — F17200 Nicotine dependence, unspecified, uncomplicated: Secondary | ICD-10-CM | POA: Insufficient documentation

## 2015-04-01 DIAGNOSIS — I10 Essential (primary) hypertension: Secondary | ICD-10-CM | POA: Insufficient documentation

## 2015-04-01 DIAGNOSIS — F4329 Adjustment disorder with other symptoms: Secondary | ICD-10-CM | POA: Diagnosis present

## 2015-04-01 DIAGNOSIS — F332 Major depressive disorder, recurrent severe without psychotic features: Secondary | ICD-10-CM | POA: Diagnosis present

## 2015-04-01 DIAGNOSIS — F0390 Unspecified dementia without behavioral disturbance: Secondary | ICD-10-CM | POA: Insufficient documentation

## 2015-04-01 LAB — SALICYLATE LEVEL: Salicylate Lvl: <4 mg/dL (ref 2.8–30.0)

## 2015-04-01 LAB — CBC
HEMATOCRIT: 36.9 % — AB (ref 39.0–52.0)
HEMOGLOBIN: 12.5 g/dL — AB (ref 13.0–17.0)
MCH: 31.4 pg (ref 26.0–34.0)
MCHC: 33.9 g/dL (ref 30.0–36.0)
MCV: 92.7 fL (ref 78.0–100.0)
Platelets: 232 10*3/uL (ref 150–400)
RBC: 3.98 MIL/uL — ABNORMAL LOW (ref 4.22–5.81)
RDW: 13.4 % (ref 11.5–15.5)
WBC: 4.2 10*3/uL (ref 4.0–10.5)

## 2015-04-01 LAB — RAPID URINE DRUG SCREEN, HOSP PERFORMED
Amphetamines: NOT DETECTED
BARBITURATES: NOT DETECTED
Benzodiazepines: NOT DETECTED
COCAINE: POSITIVE — AB
Opiates: NOT DETECTED
TETRAHYDROCANNABINOL: NOT DETECTED

## 2015-04-01 LAB — COMPREHENSIVE METABOLIC PANEL
ALBUMIN: 3.8 g/dL (ref 3.5–5.0)
ALK PHOS: 60 U/L (ref 38–126)
ALT: 21 U/L (ref 17–63)
ANION GAP: 7 (ref 5–15)
AST: 27 U/L (ref 15–41)
BILIRUBIN TOTAL: 0.3 mg/dL (ref 0.3–1.2)
BUN: 13 mg/dL (ref 6–20)
CALCIUM: 9.1 mg/dL (ref 8.9–10.3)
CO2: 27 mmol/L (ref 22–32)
CREATININE: 1.32 mg/dL — AB (ref 0.61–1.24)
Chloride: 104 mmol/L (ref 101–111)
GFR calc Af Amer: >60 mL/min (ref >60)
GFR calc non Af Amer: 55 mL/min — ABNORMAL LOW (ref >60)
GLUCOSE: 109 mg/dL — AB (ref 65–99)
Potassium: 3.9 mmol/L (ref 3.5–5.1)
SODIUM: 138 mmol/L (ref 135–145)
TOTAL PROTEIN: 6.4 g/dL — AB (ref 6.5–8.1)

## 2015-04-01 LAB — URINALYSIS, ROUTINE W REFLEX MICROSCOPIC
Bilirubin Urine: NEGATIVE
GLUCOSE, UA: NEGATIVE mg/dL
Hgb urine dipstick: NEGATIVE
KETONES UR: NEGATIVE mg/dL
LEUKOCYTES UA: NEGATIVE
Nitrite: NEGATIVE
PROTEIN: NEGATIVE mg/dL
Specific Gravity, Urine: 1.005 (ref 1.005–1.030)
pH: 7 (ref 5.0–8.0)

## 2015-04-01 LAB — ETHANOL: Alcohol, Ethyl (B): <5 mg/dL (ref <5)

## 2015-04-01 LAB — ACETAMINOPHEN LEVEL

## 2015-04-01 MED ORDER — ACETAMINOPHEN 325 MG PO TABS
650.0000 mg | ORAL_TABLET | Freq: Once | ORAL | Status: AC
  Administered 2015-04-01: 650 mg via ORAL
  Filled 2015-04-01: qty 2

## 2015-04-01 MED ORDER — LISINOPRIL 20 MG PO TABS
20.0000 mg | ORAL_TABLET | Freq: Every day | ORAL | Status: DC
  Administered 2015-04-01 – 2015-04-02 (×2): 20 mg via ORAL
  Filled 2015-04-01 (×2): qty 1

## 2015-04-01 MED ORDER — AMLODIPINE BESYLATE 5 MG PO TABS
10.0000 mg | ORAL_TABLET | Freq: Every day | ORAL | Status: DC
  Administered 2015-04-01 – 2015-04-02 (×2): 10 mg via ORAL
  Filled 2015-04-01 (×2): qty 2

## 2015-04-01 MED ORDER — CARBAMAZEPINE 200 MG PO TABS
400.0000 mg | ORAL_TABLET | Freq: Two times a day (BID) | ORAL | Status: DC
  Administered 2015-04-01 – 2015-04-02 (×2): 400 mg via ORAL
  Filled 2015-04-01 (×3): qty 2

## 2015-04-01 NOTE — ED Notes (Signed)
Staffing made aware need for sitter.  
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
## 2015-04-01 NOTE — ED Notes (Signed)
Patient states he is homeless and has been for a while and is having Suicidal  Thought when PA ask about a plan patient continued to say I am suicidal and Homicidal.

## 2015-04-01 NOTE — ED Notes (Signed)
Snack given.

## 2015-04-01 NOTE — ED Notes (Signed)
TTS in process 
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
## 2015-04-01 NOTE — ED Provider Notes (Signed)
CSN: [id number]     Arrival date & time 04/01/15  1309 History   First MD Initiated Contact with Patient 04/01/15 1417     Chief Complaint  Patient presents with  . Memory Loss  . Suicidal   (Consider location/radiation/quality/duration/timing/severity/associated sxs/prior Treatment) The history is provided by the patient. No language interpreter was used.   Carl Ortega is a 65 y.o male with a history of  PTSD, hypertension, cocaine abuse, hyponatremia, and dementia following a TBI who presents for suicidal and homicidal thoughts. He denies having a plan. She reports that he is also homeless and was kicked out of his shelter. He did not tell me the reasons for this. Says that he has not eaten in several days. He denies any alcohol or drug use. He also denies any hallucinations or anxiety.   Past Medical History  Diagnosis Date  . Hypertension   . Bipolar 1 disorder ([place])   . PTSD (post-traumatic stress disorder)   . Brain injury [facility])    Past Surgical History  Procedure Laterality Date  . None     Family History  Problem Relation Age of Onset  . Hypertension     Social History  Substance Use Topics  . Smoking status: Current Every Day Smoker  . Smokeless tobacco: None  . Alcohol Use: 2.4 oz/week    4 Standard drinks or equivalent per week    Review of Systems  Constitutional: Negative for fever and chills.  Respiratory: Negative for shortness of breath.   Cardiovascular: Negative for chest pain.  Psychiatric/Behavioral: Positive for suicidal ideas.  All other systems reviewed and are negative.     Allergies  Ketorolac  Home Medications   Prior to Admission medications   Medication Sig Start Date End Date Taking? Authorizing Provider  amLODipine (NORVASC) 10 MG tablet Take 10 mg by mouth daily.   Yes Historical Provider, MD  aspirin EC 81 MG tablet Take 81 mg by mouth daily.   Yes Historical Provider, MD  Calcium Carbonate-Vitamin D (CALCIUM 600+D) 600-400  MG-UNIT tablet Take 2 tablets by mouth daily.   Yes Historical Provider, MD  carbamazepine (TEGRETOL) 200 MG tablet Take 400 mg by mouth 2 (two) times daily.   Yes Historical Provider, MD  Cholecalciferol 1000 UNITS TBDP Take 2,000 Units by mouth daily.   Yes Historical Provider, MD  lisinopril (PRINIVIL,ZESTRIL) 20 MG tablet Take 1 tablet (20 mg total) by mouth daily. 03/15/15  Yes Fritzi Mandes, MD  loratadine (CLARITIN) 10 MG tablet Take 10 mg by mouth daily as needed for allergies.   Yes Historical Provider, MD  Melatonin 5 MG TABS Take 5 mg by mouth at bedtime.   Yes Historical Provider, MD  Multiple Vitamins-Minerals (MULTIVITAMIN WITH MINERALS) tablet Take 1 tablet by mouth daily.   Yes Historical Provider, MD  Omega-3 Fatty Acids (FISH OIL) 1000 MG CAPS Take 2,000 mg by mouth 2 (two) times daily.   Yes Historical Provider, MD  omeprazole (PRILOSEC) 20 MG capsule Take 20 mg by mouth daily.   Yes Historical Provider, MD  propranolol ER (INDERAL LA) 80 MG 24 hr capsule Take 80 mg by mouth daily.   Yes Historical Provider, MD  sennosides-docusate sodium (SENOKOT-S) 8.6-50 MG tablet Take 1 tablet by mouth 2 (two) times daily.   Yes Historical Provider, MD  vitamin B-12 (CYANOCOBALAMIN) 1000 MCG tablet Take 1,000 mcg by mouth daily.   Yes Historical Provider, MD   BP 169/104 mmHg  Pulse 107  Temp(Src) 98.5 F (  36.9 C) (Oral)  Resp 18  SpO2 97% Physical Exam  Constitutional: He is oriented to person, place, and time. He appears well-developed and well-nourished.  Appears clean and well kempt.  HENT:  Head: Normocephalic and atraumatic.  Eyes: Conjunctivae are normal.  Neck: Normal range of motion. Neck supple.  Cardiovascular: Normal rate.   Pulmonary/Chest: Effort normal. No respiratory distress.  Abdominal: Soft.  Musculoskeletal: Normal range of motion.  Neurological: He is alert and oriented to person, place, and time.  Skin: Skin is warm and dry.  Psychiatric:  Suicidal ideation  with no plan. Homicidal ideation but no specific person and no plan. No hallucinations.  Nursing note and vitals reviewed.   ED Course  Procedures (including critical care time) Labs Review Labs Reviewed  COMPREHENSIVE METABOLIC PANEL - Abnormal; Notable for the following:    Glucose, Bld 109 (*)    Creatinine, Ser 1.32 (*)    Total Protein 6.4 (*)    GFR calc non Af Amer 55 (*)    All other components within normal limits  ACETAMINOPHEN LEVEL - Abnormal; Notable for the following:    Acetaminophen (Tylenol), Serum <10 (*)    All other components within normal limits  CBC - Abnormal; Notable for the following:    RBC 3.98 (*)    Hemoglobin 12.5 (*)    HCT 36.9 (*)    All other components within normal limits  URINE RAPID DRUG SCREEN, HOSP PERFORMED - Abnormal; Notable for the following:    Cocaine POSITIVE (*)    All other components within normal limits  ETHANOL  SALICYLATE LEVEL  URINALYSIS, ROUTINE W REFLEX MICROSCOPIC (NOT AT [facility])    Imaging Review No results found. I have personally reviewed and evaluated these lab results as part of my medical decision-making.   EKG Interpretation None      MDM   Final diagnoses:  Homelessness  Suicidal ideation  Homicidal ideation  Cocaine abuse   Patient presents for SI and HI but without plan or specific person. He also reports being homeless and not eating for several days. He states that he was staying at a shelter but was kicked out. Reasons unknown. He arrived with several bags and suitcases. He also reports having a headache because of his stress level. I do not believe this is related to a SAH, meningitis, temporal arteritis, or glaucoma. His vital signs are stable and he is well-appearing. His labs are not concerning for Tylenol or aspirin overdose. Ethanol level was less than 5. CBC and CMP are not concerning.  His is positive for cocaine.   Behavioral health assessment was done on the patient. It is recommended  that the patient stay overnight until the morning when he is evaluated by psychiatry. Patient is stable at this time. Hypertension allergy home medications were ordered.     Ottie Glazier, PA-C 04/01/15 1723  Sherwood Gambler, MD 04/02/15 [phone]

## 2015-04-01 NOTE — ED Notes (Signed)
Patient ambulated to the restroom without difficulty. Sitter at side.

## 2015-04-02 DIAGNOSIS — F4329 Adjustment disorder with other symptoms: Secondary | ICD-10-CM | POA: Diagnosis present

## 2015-04-02 DIAGNOSIS — F332 Major depressive disorder, recurrent severe without psychotic features: Secondary | ICD-10-CM | POA: Diagnosis present

## 2015-04-02 NOTE — Progress Notes (Signed)
CSW engaged with Patient at his bedside. Sitter present. CSW provided patient with resources for the [facility] as well as area shelters and [facility] prevention program. At patient request, CSW also provided contact information for the [facility] as well as the [facility]. Patient reports that he does not want to go back to [facility] as it was a bad environment for him and would like to receive assistance from the [place]. Patient reports that he is unable to take the city bus to the [facility] due to having numerous bags with his belongings in them with him. CSW obtained approval from Nathaniel Man, CSW AD to provide patient with a Chief Strategy Officer. Taxi in route. CSW signing off as no further CSW needs identified at this time. Please contact if new need(s) arise.   Holly Bodily, [name] [facility] ED Clinical Social Worker [phone]

## 2015-04-02 NOTE — ED Notes (Signed)
Patient refusing discharge by bus after instructions reviewed because he has too much stuff to carry and it is too cold. Pt informed accomodations made at [facility]. SW got pt cab voucher sts will leave now.

## 2015-04-02 NOTE — ED Notes (Signed)
Patient ambulated to restroom without difficulty. Sitter at side.

## 2015-04-02 NOTE — ED Provider Notes (Signed)
Her pH H evaluation with Carl Ortega [facility] provider, patient no longer stating intent of self-harm. Ranges made for the patient to be taken to homeless shelter/IRC tonight because of inclement weather. Appropriate for discharge.  Tanna Furry, MD 04/02/15 [phone]

## 2015-04-02 NOTE — Discharge Instructions (Signed)
Follow-up with your regular primary care providers. No-harm Safety Contract A no-harm safety contract is a written or verbal agreement between you and a mental health professional to promote safety. It contains specific actions and promises you agree to. The agreement also includes instructions from the therapist or doctor. The instructions will help prevent you from harming yourself or harming others. Harm can be as mild as pinching yourself, but can increase in intensity to actions like burning or cutting yourself. The extreme level of self-harm would be committing suicide. No-harm safety contracts are also sometimes referred to as a Radiographer, therapeutic, suicide Electrical engineer, no-harm agreements or decisions, or a Surveyor, mining.  REASONS FOR NO-HARM SAFETY CONTRACTS Safety contracts are just one part of an overall treatment plan to help keep you safe and free of harm. A safety contract may help to relieve anxiety, restore a sense of control, state clearly the alternatives to harm or suicide, and give you and your therapist or doctor a gauge for how you are doing in between visits. Many factors impact the decision to use a no-harm safety contract and its effectiveness. A proper overall treatment plan and evaluation and good patient understanding are the keys to good outcomes. CONTRACT ELEMENTS  A contract can range from simple to complex. They include all or some of the following:  Action statements. These are statements you agree to do or not do. Example: If I feel my life is becoming too difficult, I agree to do the following so there is no harm to myself or others:  Talk with family or friends.  Rid myself of all things that I could use to harm myself.  Do an activity I enjoy or have enjoyed in the recent past. Coping strategies. These are ways to think and feel that decrease stress, such as:  Use of affirmations or positive statements about self.  Good self-care, including improved  grooming, and healthy eating, and healthy sleeping patterns.  Increase physical exercise.  Increase social involvement.  Focus on positive aspects of life. Crisis management. This would include what to do if there was trouble following the contract or an urge to harm. This might include notifying family or your therapist of suicidal thoughts. Be open and honest about suicidal urges. To prevent a crisis, do the following:  List reasons to reach out for support.  Keep contact numbers and available hours handy. Treatment goals. These are goals would include no suicidal thoughts, improved mood, and feelings of hopefulness. Listed responsibilities of different people involved in care. This could include family members. A family member may agree to remove firearms or other lethal weapons/substances from your ease of access. A timeline. A timeline can be in place from one therapy session to the next session. HOME CARE INSTRUCTIONS   Follow your no-harm safety contract.  Contact your therapist and/or doctor if you have any questions or concerns. MAKE SURE YOU:   Understand these instructions.  Will watch your condition. Noticing any mood changes or suicidal urges.  Will get help right away if you are not doing well or get worse.   This information is not intended to replace advice given to you by your health care provider. Make sure you discuss any questions you have with your health care provider.   Document Released: 08/31/2009 Document Revised: 04/03/2014 Document Reviewed: 08/31/2009 Elsevier Interactive Patient Education [employer].

## 2015-04-02 NOTE — Consult Note (Signed)
Telepsych Consultation   Reason for Consult:  Suicidal statements, improving Referring Physician:  EDP Patient Identification: Carl Ortega MRN:  [id number] Principal Diagnosis: MDD (major depressive disorder), recurrent severe, without psychosis ([place]) Diagnosis:   Patient Active Problem List   Diagnosis Date Noted  . MDD (major depressive disorder), recurrent severe, without psychosis ([place]) [F33.2] 04/02/2015    Priority: High  . Adjustment disorder with disturbance of emotion [F43.29] 04/02/2015  . Hyponatremia [E87.1] 03/13/2015  . PTSD (post-traumatic stress disorder) [F43.10] 12/03/2014  . Hypertension [I10] 12/03/2014  . Cocaine abuse [F14.10] 12/03/2014  . 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 male patient admitted with reports of suicidal ideation with various plans, yet inconsistent statements about such. Objective: Pt seen and chart reviewed. Pt is alert/oriented x4, calm, cooperative, and appropriate to situation. Pt denies suicidal/homicidal ideation and psychosis and does not appear to be responding to internal stimuli. Pt reports that he "was suicidal last night" because he was thinking about being homeless and his "TBI worsening from 46.". Pt does report some struggles with memory but this is not evident during conversation. There is some very minor slurring of speech consistent with chronic TBI, but pt presents as appropriate from an intellectual standpoint and is able to follow the conversation in a linear and logical fashion. Pt reports that he would like to go to [facility] center, but is open to other options and "anything besides that shelter in [place]."  HPI:  I have reviewed and concur with HPI elements below, modified as follows: Carl Ortega is an 65 y.o. male. Pt reports SI with various plans. Pt reports HI with no specific names. Pt reports  auditory hallucinations. Pt states he has a TBI, has PTSD, and depression. 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 Pt states he is a Pt at [facility] but he prefers [facility]. Pt states he sees a psychiatrist in [place] but cannot recall the medication he is currently prescribed. Pt states if he does not receive inpatient care he would like to go to [place], [place]. Pt is homeless. Pt states "I don't have anything." Pt denies family/friend support. Pt states that his TBI is worsening. Pt reports severe memory loss. Pt denies SA. Pt denies abuse.  Pt spent the night in the ED without incident. Telepsych performed as above, pt is improving before consult.   Past Medical History:  Past Medical History  Diagnosis Date  . Hypertension   . Bipolar 1 disorder ([place])   . PTSD (post-traumatic stress disorder)   . Brain injury [facility])     Past Surgical History  Procedure Laterality Date  . None     Family History:  Family History  Problem Relation Age of Onset  . Hypertension     Social History:  History  Alcohol Use  . 2.4 oz/week  . 4 Standard drinks or equivalent per week     History  Drug Use  . Yes    Social History   Social History  . Marital Status: Single    Spouse Name: N/A  . Number of Children: N/A  . Years of Education: N/A   Social History  Main Topics  . Smoking status: Current Every Day Smoker  . Smokeless tobacco: None  . Alcohol Use: 2.4 oz/week    4 Standard drinks or equivalent per week  . Drug Use: Yes  . Sexual Activity: Not Asked   Other Topics Concern  . None   Social History Narrative   Additional Social History:    Pain Medications: Pt denies Prescriptions: Pt denies Over the Counter: Pt denies History of alcohol / drug use?: No history of alcohol / drug abuse Longest period of  sobriety (when/how long): NA                     Allergies:   Allergies  Allergen Reactions  . Ketorolac Rash    Labs:  Results for orders placed or performed during the hospital encounter of 04/01/15 (from the past 48 hour(s))  Urine rapid drug screen (hosp performed) (Not at [facility])     Status: Abnormal   Collection Time: 04/01/15  2:54 PM  Result Value Ref Range   Opiates NONE DETECTED NONE DETECTED   Cocaine POSITIVE (A) NONE DETECTED   Benzodiazepines NONE DETECTED NONE DETECTED   Amphetamines NONE DETECTED NONE DETECTED   Tetrahydrocannabinol NONE DETECTED NONE DETECTED   Barbiturates NONE DETECTED NONE DETECTED    Comment:        DRUG SCREEN FOR MEDICAL PURPOSES ONLY.  IF CONFIRMATION IS NEEDED FOR ANY PURPOSE, NOTIFY LAB WITHIN 5 DAYS.        LOWEST DETECTABLE LIMITS FOR URINE DRUG SCREEN Drug Class       Cutoff (ng/mL) Amphetamine      1000 Barbiturate      200 Benzodiazepine   728 Tricyclics       206 Opiates          300 Cocaine          300 THC              50   Urinalysis, Routine w reflex microscopic (not at [facility])     Status: None   Collection Time: 04/01/15  2:54 PM  Result Value Ref Range   Color, Urine YELLOW YELLOW   APPearance CLEAR CLEAR   Specific Gravity, Urine 1.005 1.005 - 1.030   pH 7.0 5.0 - 8.0   Glucose, UA NEGATIVE NEGATIVE mg/dL   Hgb urine dipstick NEGATIVE NEGATIVE   Bilirubin Urine NEGATIVE NEGATIVE   Ketones, ur NEGATIVE NEGATIVE mg/dL   Protein, ur NEGATIVE NEGATIVE mg/dL   Nitrite NEGATIVE NEGATIVE   Leukocytes, UA NEGATIVE NEGATIVE    Comment: MICROSCOPIC NOT DONE ON URINES WITH NEGATIVE PROTEIN, BLOOD, LEUKOCYTES, NITRITE, OR GLUCOSE <1000 mg/dL.  Comprehensive metabolic panel     Status: Abnormal   Collection Time: 04/01/15  3:04 PM  Result Value Ref Range   Sodium 138 135 - 145 mmol/L   Potassium 3.9 3.5 - 5.1 mmol/L   Chloride 104 101 - 111 mmol/L   CO2 27 22 - 32 mmol/L   Glucose, Bld 109 (H) 65 - 99 mg/dL    BUN 13 6 - 20 mg/dL   Creatinine, Ser 1.32 (H) 0.61 - 1.24 mg/dL   Calcium 9.1 8.9 - 10.3 mg/dL   Total Protein 6.4 (L) 6.5 - 8.1 g/dL   Albumin 3.8 3.5 - 5.0 g/dL   AST 27 15 - 41 U/L   ALT 21 17 - 63 U/L   Alkaline Phosphatase 60 38 - 126 U/L   Total Bilirubin 0.3 0.3 - 1.2  mg/dL   GFR calc non Af Amer 55 (L) >60 mL/min   GFR calc Af Amer >60 >60 mL/min    Comment: (NOTE) The eGFR has been calculated using the CKD EPI equation. This calculation has not been validated in all clinical situations. eGFR's persistently <60 mL/min signify possible Chronic Kidney Disease.    Anion gap 7 5 - 15  Ethanol (ETOH)     Status: None   Collection Time: 04/01/15  3:04 PM  Result Value Ref Range   Alcohol, Ethyl (B) <5 <5 mg/dL    Comment:        LOWEST DETECTABLE LIMIT FOR SERUM ALCOHOL IS 5 mg/dL FOR MEDICAL PURPOSES ONLY   Salicylate level     Status: None   Collection Time: 04/01/15  3:04 PM  Result Value Ref Range   Salicylate Lvl <6.3 2.8 - 30.0 mg/dL  Acetaminophen level     Status: Abnormal   Collection Time: 04/01/15  3:04 PM  Result Value Ref Range   Acetaminophen (Tylenol), Serum <10 (L) 10 - 30 ug/mL    Comment:        THERAPEUTIC CONCENTRATIONS VARY SIGNIFICANTLY. A RANGE OF 10-30 ug/mL MAY BE AN EFFECTIVE CONCENTRATION FOR MANY PATIENTS. HOWEVER, SOME ARE BEST TREATED AT CONCENTRATIONS OUTSIDE THIS RANGE. ACETAMINOPHEN CONCENTRATIONS >150 ug/mL AT 4 HOURS AFTER INGESTION AND >50 ug/mL AT 12 HOURS AFTER INGESTION ARE OFTEN ASSOCIATED WITH TOXIC REACTIONS.   CBC     Status: Abnormal   Collection Time: 04/01/15  3:04 PM  Result Value Ref Range   WBC 4.2 4.0 - 10.5 K/uL   RBC 3.98 (L) 4.22 - 5.81 MIL/uL   Hemoglobin 12.5 (L) 13.0 - 17.0 g/dL   HCT 36.9 (L) 39.0 - 52.0 %   MCV 92.7 78.0 - 100.0 fL   MCH 31.4 26.0 - 34.0 pg   MCHC 33.9 30.0 - 36.0 g/dL   RDW 13.4 11.5 - 15.5 %   Platelets 232 150 - 400 K/uL    Vitals: Blood pressure 124/76, pulse 74,  temperature 98.5 F (36.9 C), temperature source Oral, resp. rate 18, SpO2 98 %.  Risk to Self: Suicidal Ideation: Yes-Currently Present Suicidal Intent: No Is patient at risk for suicide?: No Suicidal Plan?: No Access to Means: No What has been your use of drugs/alcohol within the last 12 months?: NA How many times?: 2 Other Self Harm Risks: NA Triggers for Past Attempts: None known Intentional Self Injurious Behavior: None Risk to Others: Homicidal Ideation: Yes-Currently Present Thoughts of Harm to Others: No Current Homicidal Intent: No Current Homicidal Plan: No Access to Homicidal Means: No Identified Victim: NA History of harm to others?: No Assessment of Violence: None Noted Violent Behavior Description: NA Does patient have access to weapons?: No Criminal Charges Pending?: No Does patient have a court date: No Prior Inpatient Therapy: Prior Inpatient Therapy: No Prior Therapy Dates: NA Prior Therapy Facilty/Provider(s): NA Reason for Treatment: NA Prior Outpatient Therapy: Prior Outpatient Therapy: Yes Prior Therapy Dates: 2015 Prior Therapy Facilty/Provider(s): [facility] Reason for Treatment: TBI, depression Does patient have an ACCT team?: No Does patient have Intensive In-House Services?  : No Does patient have [facility] services? : No Does patient have P4CC services?: No  Current Facility-Administered Medications  Medication Dose Route Frequency Provider Last Rate Last Dose  . amLODipine (NORVASC) tablet 10 mg  10 mg Oral Daily Hanna Patel-Mills, PA-C   10 mg at 04/02/15 0940  . carbamazepine (TEGRETOL) tablet 400 mg  400 mg  Oral BID Hanna Patel-Mills, PA-C   400 mg at 04/02/15 0940  . lisinopril (PRINIVIL,ZESTRIL) tablet 20 mg  20 mg Oral Daily Hanna Patel-Mills, PA-C   20 mg at 04/02/15 0940   Current Outpatient Prescriptions  Medication Sig Dispense Refill  . amLODipine (NORVASC) 10 MG tablet Take 10 mg by mouth daily.    Marland Kitchen aspirin EC 81 MG tablet Take 81  mg by mouth daily.    . Calcium Carbonate-Vitamin D (CALCIUM 600+D) 600-400 MG-UNIT tablet Take 2 tablets by mouth daily.    . carbamazepine (TEGRETOL) 200 MG tablet Take 400 mg by mouth 2 (two) times daily.    . Cholecalciferol 1000 UNITS TBDP Take 2,000 Units by mouth daily.    Marland Kitchen lisinopril (PRINIVIL,ZESTRIL) 20 MG tablet Take 1 tablet (20 mg total) by mouth daily. 30 tablet 2  . loratadine (CLARITIN) 10 MG tablet Take 10 mg by mouth daily as needed for allergies.    . Melatonin 5 MG TABS Take 5 mg by mouth at bedtime.    . Multiple Vitamins-Minerals (MULTIVITAMIN WITH MINERALS) tablet Take 1 tablet by mouth daily.    . Omega-3 Fatty Acids (FISH OIL) 1000 MG CAPS Take 2,000 mg by mouth 2 (two) times daily.    Marland Kitchen omeprazole (PRILOSEC) 20 MG capsule Take 20 mg by mouth daily.    . propranolol ER (INDERAL LA) 80 MG 24 hr capsule Take 80 mg by mouth daily.    . sennosides-docusate sodium (SENOKOT-S) 8.6-50 MG tablet Take 1 tablet by mouth 2 (two) times daily.    . vitamin B-12 (CYANOCOBALAMIN) 1000 MCG tablet Take 1,000 mcg by mouth daily.      Musculoskeletal: UTO, camera  Psychiatric Specialty Exam: Physical Exam  Review of Systems  Psychiatric/Behavioral: Positive for depression. Negative for suicidal ideas. The patient is nervous/anxious and has insomnia.   All other systems reviewed and are negative.   Blood pressure 124/76, pulse 74, temperature 98.5 F (36.9 C), temperature source Oral, resp. rate 18, SpO2 98 %.There is no weight on file to calculate BMI.  General Appearance: Casual and Fairly Groomed  Eye Contact::  Good  Speech:  Clear and Coherent and Normal Rate, mild slurring  Volume:  Normal  Mood:  Anxious and Depressed  Affect:  Appropriate and Congruent  Thought Process:  Circumstantial  Orientation:  Full (Time, Place, and Person)  Thought Content:  WDL  Suicidal Thoughts:  No  Homicidal Thoughts:  No  Memory:  Immediate;   Fair Recent;   Fair Remote;   Fair   Judgement:  Fair  Insight:  Fair  Psychomotor Activity:  Normal  Concentration:  Fair  Recall:  [employer] of Knowledge:Fair  Language: Fair  Akathisia:  No  Handed:    AIMS (if indicated):     Assets:  Communication Skills Desire for Improvement Resilience Social Support  ADL's:  Intact  Cognition: WNL  Sleep:      Treatment Plan Summary: Daily contact with patient to assess and evaluate symptoms and progress in treatment and Medication management  Plan:  No evidence of imminent risk to self or others at present.    Disposition:  -Discharge home  -SW to assist with shelter (send to [facility] for a safe/warm place to sleep)  Benjamine Mola, FNP-BC 04/02/2015 11:56 AM

## 2015-06-07 DIAGNOSIS — F4312 Post-traumatic stress disorder, chronic: Secondary | ICD-10-CM | POA: Diagnosis not present

## 2015-06-07 DIAGNOSIS — R45850 Homicidal ideations: Secondary | ICD-10-CM | POA: Diagnosis not present

## 2015-06-07 DIAGNOSIS — S069X9S Unspecified intracranial injury with loss of consciousness of unspecified duration, sequela: Secondary | ICD-10-CM | POA: Diagnosis not present

## 2015-06-07 DIAGNOSIS — R441 Visual hallucinations: Secondary | ICD-10-CM | POA: Diagnosis not present

## 2015-06-07 DIAGNOSIS — F319 Bipolar disorder, unspecified: Secondary | ICD-10-CM | POA: Diagnosis not present

## 2015-06-07 DIAGNOSIS — R45851 Suicidal ideations: Secondary | ICD-10-CM | POA: Diagnosis not present

## 2015-06-07 DIAGNOSIS — R51 Headache: Secondary | ICD-10-CM | POA: Diagnosis not present

## 2015-06-07 DIAGNOSIS — R440 Auditory hallucinations: Secondary | ICD-10-CM | POA: Diagnosis not present

## 2015-07-13 ENCOUNTER — Encounter: Payer: Self-pay | Admitting: Emergency Medicine

## 2015-07-13 ENCOUNTER — Emergency Department
Admission: EM | Admit: 2015-07-13 | Discharge: 2015-07-13 | Disposition: A | Discharge status: Home / Self Care | Payer: Medicare Other | Attending: Student | Admitting: Student

## 2015-07-13 DIAGNOSIS — I10 Essential (primary) hypertension: Secondary | ICD-10-CM | POA: Diagnosis not present

## 2015-07-13 DIAGNOSIS — F17200 Nicotine dependence, unspecified, uncomplicated: Secondary | ICD-10-CM | POA: Insufficient documentation

## 2015-07-13 DIAGNOSIS — F0280 Dementia in other diseases classified elsewhere without behavioral disturbance: Secondary | ICD-10-CM

## 2015-07-13 DIAGNOSIS — F4312 Post-traumatic stress disorder, chronic: Secondary | ICD-10-CM | POA: Diagnosis not present

## 2015-07-13 DIAGNOSIS — F4310 Post-traumatic stress disorder, unspecified: Secondary | ICD-10-CM | POA: Diagnosis not present

## 2015-07-13 DIAGNOSIS — F4329 Adjustment disorder with other symptoms: Secondary | ICD-10-CM | POA: Diagnosis not present

## 2015-07-13 DIAGNOSIS — Z046 Encounter for general psychiatric examination, requested by authority: Secondary | ICD-10-CM | POA: Diagnosis present

## 2015-07-13 DIAGNOSIS — F69 Unspecified disorder of adult personality and behavior: Secondary | ICD-10-CM | POA: Diagnosis not present

## 2015-07-13 DIAGNOSIS — S069XAS Unspecified intracranial injury with loss of consciousness status unknown, sequela: Secondary | ICD-10-CM

## 2015-07-13 DIAGNOSIS — F332 Major depressive disorder, recurrent severe without psychotic features: Secondary | ICD-10-CM

## 2015-07-13 DIAGNOSIS — S069X9S Unspecified intracranial injury with loss of consciousness of unspecified duration, sequela: Secondary | ICD-10-CM

## 2015-07-13 LAB — SALICYLATE LEVEL: Salicylate Lvl: <4 mg/dL (ref 2.8–30.0)

## 2015-07-13 LAB — ACETAMINOPHEN LEVEL: Acetaminophen (Tylenol), Serum: <10 ug/mL — ABNORMAL LOW (ref 10–30)

## 2015-07-13 LAB — URINE DRUG SCREEN, QUALITATIVE (ARMC ONLY)
Amphetamines, Ur Screen: NOT DETECTED
BARBITURATES, UR SCREEN: NOT DETECTED
Benzodiazepine, Ur Scrn: NOT DETECTED
CANNABINOID 50 NG, UR ~~LOC~~: POSITIVE — AB
COCAINE METABOLITE, UR ~~LOC~~: NOT DETECTED
MDMA (Ecstasy)Ur Screen: NOT DETECTED
Methadone Scn, Ur: NOT DETECTED
OPIATE, UR SCREEN: NOT DETECTED
PHENCYCLIDINE (PCP) UR S: NOT DETECTED
Tricyclic, Ur Screen: NOT DETECTED

## 2015-07-13 LAB — COMPREHENSIVE METABOLIC PANEL
ALBUMIN: 4.4 g/dL (ref 3.5–5.0)
ALK PHOS: 67 U/L (ref 38–126)
ALT: 22 U/L (ref 17–63)
AST: 29 U/L (ref 15–41)
Anion gap: 5 (ref 5–15)
BILIRUBIN TOTAL: 0.6 mg/dL (ref 0.3–1.2)
BUN: 16 mg/dL (ref 6–20)
CALCIUM: 9.6 mg/dL (ref 8.9–10.3)
CO2: 27 mmol/L (ref 22–32)
CREATININE: 1.14 mg/dL (ref 0.61–1.24)
Chloride: 107 mmol/L (ref 101–111)
GFR calc Af Amer: >60 mL/min (ref >60)
GFR calc non Af Amer: >60 mL/min (ref >60)
GLUCOSE: 90 mg/dL (ref 65–99)
Potassium: 3.8 mmol/L (ref 3.5–5.1)
SODIUM: 139 mmol/L (ref 135–145)
Total Protein: 7.6 g/dL (ref 6.5–8.1)

## 2015-07-13 LAB — CBC
HEMATOCRIT: 45.1 % (ref 40.0–52.0)
HEMOGLOBIN: 15.1 g/dL (ref 13.0–18.0)
MCH: 29.7 pg (ref 26.0–34.0)
MCHC: 33.4 g/dL (ref 32.0–36.0)
MCV: 89 fL (ref 80.0–100.0)
Platelets: 238 10*3/uL (ref 150–440)
RBC: 5.07 MIL/uL (ref 4.40–5.90)
RDW: 13.3 % (ref 11.5–14.5)
WBC: 7.1 10*3/uL (ref 3.8–10.6)

## 2015-07-13 LAB — ETHANOL: Alcohol, Ethyl (B): <5 mg/dL (ref <5)

## 2015-07-13 NOTE — ED Notes (Signed)
Report from Katie, RN

## 2015-07-13 NOTE — ED Notes (Addendum)
Pt here via [facility]; reports "not feeling well"; pt is homeless, reports hx of PTSD, TBI, schizophrenia and bipolar. Pt reports he's been taking his meds, they aren't working. Pt reports he's "about to have a break". Pt calm and cooperative upon arrival. Pt denies SI, HI or hallucinations.

## 2015-07-13 NOTE — ED Provider Notes (Signed)
[facility] Emergency Department Provider Note  ____________________________________________  Time seen: Approximately 8:00 AM  I have reviewed the triage vital signs and the nursing notes.   HISTORY  Chief Complaint Psychiatric Evaluation    HPI Carl Ortega is a 65 y.o. male history of hypertension, bipolar disorder, PTSD, who presents for evaluation of depression and emotional lability which has been ongoing for several weeks, constant since onset, severe, worse in the setting of him being recently homeless. No SI, HI or audiovisual hallucinations. no chest pain or difficulty breathing. No abdominal pain, vomiting, diarrhea, fevers or chills. He does feel "just bad" but is not able to give me further history about what that means. He has had poor sleep and has not had a good appetite.   Past Medical History  Diagnosis Date  . Hypertension   . Bipolar 1 disorder ([place])   . PTSD (post-traumatic stress disorder)   . Brain injury [facility])     Patient Active Problem List   Diagnosis Date Noted  . MDD (major depressive disorder), recurrent severe, without psychosis ([place]) 04/02/2015  . Adjustment disorder with disturbance of emotion 04/02/2015  . Hyponatremia 03/13/2015  . PTSD (post-traumatic stress disorder) 12/03/2014  . Hypertension 12/03/2014  . Cocaine abuse 12/03/2014  . 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 None      Current Outpatient Rx  Name  Route  Sig  Dispense  Refill  . acetaminophen (TYLENOL) 325 MG tablet   Oral   Take 650 mg by mouth 3 (three) times daily as needed for mild pain, moderate pain or fever.         Marland Kitchen amLODipine (NORVASC) 10 MG tablet   Oral   Take 10 mg by mouth daily.         Marland Kitchen aspirin EC 81 MG tablet   Oral   Take 81 mg by mouth daily.         . bacitracin 500 UNIT/GM ointment   Topical   Apply 1 application topically 2 (two) times  daily as needed.         . Calcium Carbonate-Vitamin D (CALCIUM 600+D) 600-400 MG-UNIT tablet   Oral   Take 2 tablets by mouth daily.         . carbamazepine (TEGRETOL) 200 MG tablet   Oral   Take 400 mg by mouth 2 (two) times daily.         . clonazePAM (KLONOPIN) 1 MG tablet   Oral   Take 0.5-1 mg by mouth 2 (two) times daily. Take 0.5mg  every morning and take 1mg  at bedtime for anxiety         . diclofenac (VOLTAREN) 50 MG EC tablet   Oral   Take 100 mg by mouth 2 (two) times daily as needed for mild pain or moderate pain.         . hydroxypropyl methylcellulose / hypromellose (ISOPTO TEARS / GONIOVISC) 2.5 % ophthalmic solution   Both Eyes   Place 1 drop into both eyes 2 (two) times daily.         . hydrOXYzine (ATARAX/VISTARIL) 25 MG tablet   Oral   Take 25 mg by mouth every evening.         Marland Kitchen lisinopril-hydrochlorothiazide (PRINZIDE,ZESTORETIC) 20-25 MG tablet   Oral   Take 2 tablets by mouth daily.         Marland Kitchen loratadine (CLARITIN) 10 MG tablet  Oral   Take 10 mg by mouth daily as needed for allergies.         . Melatonin 3 MG TABS   Oral   Take 6 mg by mouth every evening. Between 1800-2000         . Multiple Vitamins-Minerals (MULTIVITAMIN WITH MINERALS) tablet   Oral   Take 1 tablet by mouth daily.         Marland Kitchen omeprazole (PRILOSEC) 20 MG capsule   Oral   Take 20 mg by mouth daily.         . propranolol ER (INDERAL LA) 80 MG 24 hr capsule   Oral   Take 160 mg by mouth daily.          . QUEtiapine (SEROQUEL) 100 MG tablet   Oral   Take 100 mg by mouth at bedtime.         . sennosides-docusate sodium (SENOKOT-S) 8.6-50 MG tablet   Oral   Take 2 tablets by mouth daily. To prevent constipation         . sildenafil (VIAGRA) 100 MG tablet   Oral   Take 100 mg by mouth daily as needed for erectile dysfunction.         . traZODone (DESYREL) 100 MG tablet   Oral   Take 150 mg by mouth at bedtime.            Allergies Ketorolac  Family History  Problem Relation Age of Onset  . Hypertension      Social History Social History  Substance Use Topics  . Smoking status: Current Every Day Smoker  . Smokeless tobacco: None  . Alcohol Use: 2.4 oz/week    4 Standard drinks or equivalent per week    Review of Systems Constitutional: No fever/chills Eyes: No visual changes. ENT: No sore throat. Cardiovascular: Denies chest pain. Respiratory: Denies shortness of breath. Gastrointestinal: No abdominal pain.  No nausea, no vomiting.  No diarrhea.  No constipation. Genitourinary: Negative for dysuria. Musculoskeletal: Negative for back pain. Skin: Negative for rash. Neurological: Negative for headaches, focal weakness or numbness.  10-point ROS otherwise negative.  ____________________________________________   PHYSICAL EXAM:  VITAL SIGNS: ED Triage Vitals  Enc Vitals Group     BP 07/13/15 0739 140/75 mmHg     Pulse Rate 07/13/15 0739 79     Resp 07/13/15 0739 18     Temp 07/13/15 0739 97.5 F (36.4 C)     Temp Source 07/13/15 0739 Oral     SpO2 07/13/15 0739 97 %     Weight 07/13/15 0739 155 lb (70.308 kg)     Height 07/13/15 0739 5\' 7"  (1.702 m)     Head Cir --      Peak Flow --      Pain Score --      Pain Loc --      Pain Edu? --      Excl. in [place]? --     Constitutional: Alert and oriented. Well appearing and in no acute distress. Eyes: Conjunctivae are normal. PERRL. EOMI. Head: Atraumatic. Nose: No congestion/rhinnorhea. Mouth/Throat: Mucous membranes are moist.  Oropharynx non-erythematous. Neck: No stridor. Supple without meningismus. Cardiovascular: Normal rate, regular rhythm. Grossly normal heart sounds.  Good peripheral circulation. Respiratory: Normal respiratory effort.  No retractions. Lungs CTAB. Gastrointestinal: Soft and nontender. No distention.  No CVA tenderness. Genitourinary: deferred Musculoskeletal: No lower extremity tenderness nor edema.   No joint effusions. Neurologic:  Normal speech and language. No  gross focal neurologic deficits are appreciated. No gait instability. Skin:  Skin is warm, dry and intact. No rash noted. Psychiatric: Mood is depressed. Normal affect. Speech and behavior are normal.  ____________________________________________   LABS (all labs ordered are listed, but only abnormal results are displayed)  Labs Reviewed  ACETAMINOPHEN LEVEL - Abnormal; Notable for the following:    Acetaminophen (Tylenol), Serum <10 (*)    All other components within normal limits  URINE DRUG SCREEN, QUALITATIVE ([facility] ONLY) - Abnormal; Notable for the following:    Cannabinoid 50 Ng, Ur [place] POSITIVE (*)    All other components within normal limits  COMPREHENSIVE METABOLIC PANEL  ETHANOL  SALICYLATE LEVEL  CBC   ____________________________________________  EKG  none ____________________________________________  RADIOLOGY  none ____________________________________________   PROCEDURES  Procedure(s) performed: None  Critical Care performed: No  ____________________________________________   INITIAL IMPRESSION / ASSESSMENT AND PLAN / ED COURSE  Pertinent labs & imaging results that were available during my care of the patient were reviewed by me and considered in my medical decision making (see chart for details).  Ashtan Stipes is a 65 y.o. male history of hypertension, bipolar disorder, PTSD, who presents for evaluation of depression and emotional lability which has been ongoing for several weeks. He feels as if his medications are not helping. On exam, he is well-appearing and in no acute distress. His vital signs are stable, he is afebrile. He has a benign physical examination no acute medical complaints. CMP, CBC, acetaminophen, ethanol and salicylate levels are unremarkable. Urine drug screen positive for cannabinoids. Patient is medically clear. No indication for involuntary commitment. Will consult  TTS and psychiatry.  ----------------------------------------- 12:40 PM on 07/13/2015 ----------------------------------------- Dr. Weber Cooks has evaluated the patient recommends [facility] home with [facility]. Patient is comfortable with the plan.DC.  ____________________________________________   FINAL CLINICAL IMPRESSION(S) / ED DIAGNOSES  Final diagnoses:  MDD (major depressive disorder), recurrent severe, without psychosis ([place])  PTSD (post-traumatic stress disorder)      Joanne Gavel, MD 07/13/15 1240

## 2015-07-13 NOTE — ED Notes (Signed)
Pt alert and oriented X4, active, cooperative, pt in NAD. RR even and unlabored, color WNL.  Pt informed to return if any life threatening symptoms occur.  Pt states that he does not want anything for headache, states he has his own medicines.

## 2015-07-13 NOTE — ED Notes (Signed)
Pt given breakfast tray

## 2015-07-13 NOTE — BH Assessment (Signed)
Assessment Note  Carl Ortega is an 65 y.o. male. Who presented to the ED Voluntarily for a psychiatric evaluation. Pt states that his was walking down town and began to feel angry, tried and fatigued. Pt state that he believes that his TBI is " acting up" . Pt reports that he is currently homeless but as of late he has been staying with his ex-wife (for a couple of days). Pt reports that while there he has been taking his medications but he also states that they are not working effectively. Pt reports that his SSI benefits were transfer somehow as of this month and he is no longer receiving them. Pt reports that this has been a major trigger for him and that he has been overwhelmed and upset ever since. [facility]ized [facility] is somehow responsible for the termination/transfer of his SSI benefits. Pt reports thinking about walking in to traffic on last night. Pt states that these suicidal thoughts come and go.  Pt. denies any homicidal  ideation, plan or intent. Pt. endores the presence of both auditory or visual hallucinations at this time. Pt reports that this has been an ongoing problem for him.The patient does meet admission criteria at this time. This was explained to the pt, who voiced understanding.   Diagnosis: Schizophrenia   Past Medical History:  Past Medical History  Diagnosis Date  . Hypertension   . Bipolar 1 disorder ([place])   . PTSD (post-traumatic stress disorder)   . Brain injury [facility])     Past Surgical History  Procedure Laterality Date  . None      Family History:  Family History  Problem Relation Age of Onset  . Hypertension      Social History:  reports that he has been smoking.  He does not have any smokeless tobacco history on file. He reports that he drinks about 2.4 oz of alcohol per week. He reports that he uses illicit drugs.  Additional Social History:  Alcohol / Drug  Use Pain Medications: Pt denies Prescriptions: Pt denies Over the Counter: Pt denies History of alcohol / drug use?: No history of alcohol / drug abuse Longest period of sobriety (when/how long): NA  CIWA: CIWA-Ar BP: 140/75 mmHg Pulse Rate: 79 COWS:    Allergies:  Allergies  Allergen Reactions  . Ketorolac Rash    Home Medications:  (Not in a hospital admission)  OB/GYN Status:  No LMP for male patient.  General Assessment Data Location of Assessment: [facility] ED TTS Assessment: In system Is this a Tele or Face-to-Face Assessment?: Face-to-Face Is this an Initial Assessment or a Re-assessment for this encounter?: Initial Assessment Marital status: Single Is patient pregnant?: No Pregnancy Status: No Living Arrangements: Other (Comment) (Homeless) Can pt return to current living arrangement?: Yes Admission Status: Voluntary Is patient capable of signing voluntary admission?: Yes Referral Source: Self/Family/Friend Insurance type: Medicare   Medical Screening Exam ([place]) Medical Exam completed: Yes  Crisis Care Plan Living Arrangements: Other (Comment) (Homeless) Legal Guardian: Other: (None ) Name of Psychiatrist: [place], [place]  Name of Therapist: None   Education Status Is patient currently in school?: No Current Grade: N/A Highest grade of school patient has completed: 12 Name of school: N/A Contact person: N/A  Risk to self with the past 6 months Suicidal Ideation: No-Not Currently/Within Last 6 Months Has patient been a risk to self within the past  6 months prior to admission? : Yes Suicidal Intent: No-Not Currently/Within Last 6 Months Has patient had any suicidal intent within the past 6 months prior to admission? : Yes Is patient at risk for suicide?: Yes Suicidal Plan?: No-Not Currently/Within Last 6 Months Has patient had any suicidal plan within the past 6 months prior to admission? : Yes Specify Current Suicidal Plan: To walk into  traffic   Access to Means: Yes What has been your use of drugs/alcohol within the last 12 months?: Pt denies  Previous Attempts/Gestures: Yes How many times?:  (Multiple ) Other Self Harm Risks: None  Triggers for Past Attempts: Unpredictable Intentional Self Injurious Behavior: None Family Suicide History: Yes Recent stressful life event(s): Conflict (Comment), Financial Problems, Loss (Comment) Persecutory voices/beliefs?: Yes Depression: Yes Depression Symptoms: Feeling angry/irritable, Fatigue Substance abuse history and/or treatment for substance abuse?: Yes Suicide prevention information given to non-admitted patients: Yes  Risk to Others within the past 6 months Homicidal Ideation: No Does patient have any lifetime risk of violence toward others beyond the six months prior to admission? : No Thoughts of Harm to Others: No Current Homicidal Intent: No Current Homicidal Plan: No Access to Homicidal Means: No Identified Victim: N/A History of harm to others?: No Assessment of Violence: None Noted Violent Behavior Description: N/A Does patient have access to weapons?: No Criminal Charges Pending?: No Does patient have a court date: No Is patient on probation?: No  Psychosis Hallucinations: Auditory, With command, Visual Delusions: Persecutory  Mental Status Report Appearance/Hygiene: In scrubs Eye Contact: Poor Motor Activity: Agitation Speech: Logical/coherent Level of Consciousness: Alert Mood: Depressed, Irritable Affect: Preoccupied, Irritable Anxiety Level: Moderate Thought Processes: Relevant, Coherent Judgement: Impaired Orientation: Place, Person, Time, Situation Obsessive Compulsive Thoughts/Behaviors: None  Cognitive Functioning Concentration: Fair Memory: Remote Intact, Recent Intact IQ: Average Insight: Fair Impulse Control: Fair Appetite: Fair Weight Loss: 0 Weight Gain: 0 Sleep: No Change Total Hours of Sleep: 6  ADLScreening [facility] Assessment  Services) Patient's cognitive ability adequate to safely complete daily activities?: Yes Patient able to express need for assistance with ADLs?: Yes Independently performs ADLs?: Yes (appropriate for developmental age)  Prior Inpatient Therapy Prior Inpatient Therapy: Yes Prior Therapy Dates: 12/02/14 Prior Therapy Facilty/Provider(s): [facility] Reason for Treatment: Hallucinations   Prior Outpatient Therapy Prior Outpatient Therapy: Yes Prior Therapy Dates: Current [facility] Reason for Treatment: PTSD, [place], TBI Does patient have an ACCT team?: No Does patient have Intensive In-House Services?  : No Does patient have [facility] services? : No Does patient have P4CC services?: No  ADL Screening (condition at time of admission) Patient's cognitive ability adequate to safely complete daily activities?: Yes Patient able to express need for assistance with ADLs?: Yes Independently performs ADLs?: Yes (appropriate for developmental age)       Abuse/Neglect Assessment (Assessment to be complete while patient is alone) Physical Abuse: Denies Verbal Abuse: Denies Sexual Abuse: Denies Exploitation of patient/patient's resources: Denies Self-Neglect: Denies Values / Beliefs Cultural Requests During Hospitalization: None Spiritual Requests During Hospitalization: None Consults Spiritual Care Consult Needed: No Social Work Consult Needed: No Regulatory affairs officer (For Healthcare) Does patient have an advance directive?: No Would patient like information on creating an advanced directive?: No - patient declined information    Additional Information 1:1 In Past 12 Months?: No CIRT Risk: No Elopement Risk: No Does patient have medical clearance?: Yes     Disposition:  Disposition Initial Assessment Completed for this Encounter: Yes Disposition of Patient: Other dispositions, Referred to Other  disposition(s):  (Consult with Psych MD) Patient referred to:  Other (Comment) (Consult with Psych MD)  On Site Evaluation by:   Reviewed with Physician:    Laretta Alstrom 07/13/2015 11:44 AM

## 2015-07-13 NOTE — Consult Note (Signed)
[facility] Consult   Reason for Consult:  Consult for this 65 year old man with a history of traumatic brain injury and posttraumatic stress disorder who presented voluntarily to the emergency room Referring Physician:  Edd Fabian Patient Identification: Corran Lalone MRN:  [id number] Principal Diagnosis: Adjustment disorder with disturbance of emotion Diagnosis:   Patient Active Problem List   Diagnosis Date Noted  . MDD (major depressive disorder), recurrent severe, without psychosis ([place]) [F33.2] 04/02/2015  . Adjustment disorder with disturbance of emotion [F43.29] 04/02/2015  . Hyponatremia [E87.1] 03/13/2015  . PTSD (post-traumatic stress disorder) [F43.10] 12/03/2014  . Hypertension [I10] 12/03/2014  . Cocaine abuse [F14.10] 12/03/2014  . 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 male patient admitted with "I've just not been doing good".  HPI:  Patient interviewed. Chart reviewed including old psychiatric notes. Case discussed with TTS and emergency room physician. Labs reviewed. 65 year old man with history of somatic stress disorder and traumatic brain injury dating back decades. He brought himself to the emergency room stating that he is just not been functioning well. He says that his mood has been sort of irritable and depressed. He indicates that he has not had a place to stay in the last several days. He says he's been sleeping in the lobby of the police station at night. His wife helped him out briefly but it sounds like he doesn't think he can go there regularly. He claims that he is completely out of money and he is irritated that he believes that the doctors at the [facility] had something to do with discontinuing his Social Security disability. Patient clearly does not understand how the system works and is unable to think through any  kind of plan to address this. He denies that he's having any suicidal thoughts. He makes some statements about how he is going to go and get angry at the [place] but doesn't indicate any actual intent to hurt assault or kill anyone. He denies that he's been having auditory or visual hallucinations. He does get all of his psychiatric and medical care through the [facility] hospital. He indicates that he has been taking his medicines recently. He just got out of the [place] [facility] about 7 or 10 days ago it sounds like. He hasn't been back there for follow-up yet. Patient denies that he's been abusing any drugs or drinking. Her Payton Mccallum  Social history: Patient is a Geologist, engineering with a diagnosis of traumatic brain injury and PTSD. He has been on multiple medications including mood stabilizers and antipsychotics and antidepressants. He gets regular follow-up at the [place]. Patient is still legally married apparently but he and his wife don't live together and don't get along although at some point she was his payee. He is not even clear whether that still the case. He currently says he doesn't have a place to stay but also that he refuses to stay at the local shelter.  Medical history: Evidently does have a documented traumatic brain injury related to [employer]. History of high blood pressure. History of some episodes of hyponatremia in the past.  Substance abuse history: Patient has a history of abuse of cocaine in the past but there is no evidence that he's been abusing drugs recently. He says that he is not using alcohol either.  Past Psychiatric History: Patient has had multiple psychiatric treatments in the past. Apparently  he was just out of the hospital at the [place]. He is denying any suicidal ideation. It's not clear to me that he is ever seriously tried to kill himself in the past. He has expressed anger but I don't think that he has been homicidal in the past. It looks like he had most recently been  maintained on carbamazepine and Seroquel.  Risk to Self: Suicidal Ideation: No-Not Currently/Within Last 6 Months Suicidal Intent: No-Not Currently/Within Last 6 Months Is patient at risk for suicide?: Yes Suicidal Plan?: No-Not Currently/Within Last 6 Months Specify Current Suicidal Plan: To walk into  traffic  Access to Means: Yes What has been your use of drugs/alcohol within the last 12 months?: Pt denies  How many times?:  (Multiple ) Other Self Harm Risks: None  Triggers for Past Attempts: Unpredictable Intentional Self Injurious Behavior: None Risk to Others: Homicidal Ideation: No Thoughts of Harm to Others: No Current Homicidal Intent: No Current Homicidal Plan: No Access to Homicidal Means: No Identified Victim: N/A History of harm to others?: No Assessment of Violence: None Noted Violent Behavior Description: N/A Does patient have access to weapons?: No Criminal Charges Pending?: No Does patient have a court date: No Prior Inpatient Therapy: Prior Inpatient Therapy: Yes Prior Therapy Dates: 12/02/14 Prior Therapy Facilty/Provider(s): [facility] Reason for Treatment: Hallucinations  Prior Outpatient Therapy: Prior Outpatient Therapy: Yes Prior Therapy Dates: Current [facility] Reason for Treatment: PTSD, [place], TBI Does patient have an ACCT team?: No Does patient have Intensive In-House Services?  : No Does patient have [facility] services? : No Does patient have P4[facility] services?: No  Past Medical History:  Past Medical History  Diagnosis Date  . Hypertension   . Bipolar 1 disorder ([place])   . PTSD (post-traumatic stress disorder)   . Brain injury [facility])     Past Surgical History  Procedure Laterality Date  . None     Family History:  Family History  Problem Relation Age of Onset  . Hypertension     Family Psychiatric  History: Patient says he thinks may be some people on his father's side had problems but he is extremely unclear about  this. Social History:  History  Alcohol Use  . 2.4 oz/week  . 4 Standard drinks or equivalent per week     History  Drug Use  . Yes    Social History   Social History  . Marital Status: Single    Spouse Name: N/A  . Number of Children: N/A  . Years of Education: N/A   Social History Main Topics  . Smoking status: Current Every Day Smoker  . Smokeless tobacco: None  . Alcohol Use: 2.4 oz/week    4 Standard drinks or equivalent per week  . Drug Use: Yes  . Sexual Activity: Not Asked   Other Topics Concern  . None   Social History Narrative   Additional Social History:    Allergies:   Allergies  Allergen Reactions  . Ketorolac Rash    Labs:  Results for orders placed or performed during the hospital encounter of 07/13/15 (from the past 48 hour(s))  Comprehensive metabolic panel     Status: None   Collection Time: 07/13/15  7:42 AM  Result Value Ref Range   Sodium 139 135 - 145 mmol/L   Potassium 3.8 3.5 - 5.1 mmol/L   Chloride 107 101 - 111 mmol/L   CO2 27 22 - 32 mmol/L   Glucose, Bld 90 65 - 99 mg/dL  BUN 16 6 - 20 mg/dL   Creatinine, Ser 1.14 0.61 - 1.24 mg/dL   Calcium 9.6 8.9 - 10.3 mg/dL   Total Protein 7.6 6.5 - 8.1 g/dL   Albumin 4.4 3.5 - 5.0 g/dL   AST 29 15 - 41 U/L   ALT 22 17 - 63 U/L   Alkaline Phosphatase 67 38 - 126 U/L   Total Bilirubin 0.6 0.3 - 1.2 mg/dL   GFR calc non Af Amer >60 >60 mL/min   GFR calc Af Amer >60 >60 mL/min    Comment: (NOTE) The eGFR has been calculated using the CKD EPI equation. This calculation has not been validated in all clinical situations. eGFR's persistently <60 mL/min signify possible Chronic Kidney Disease.    Anion gap 5 5 - 15  Ethanol (ETOH)     Status: None   Collection Time: 07/13/15  7:42 AM  Result Value Ref Range   Alcohol, Ethyl (B) <5 <5 mg/dL    Comment:        LOWEST DETECTABLE LIMIT FOR SERUM ALCOHOL IS 5 mg/dL FOR MEDICAL PURPOSES ONLY   Salicylate level     Status: None    Collection Time: 07/13/15  7:42 AM  Result Value Ref Range   Salicylate Lvl <1.4 2.8 - 30.0 mg/dL  Acetaminophen level     Status: Abnormal   Collection Time: 07/13/15  7:42 AM  Result Value Ref Range   Acetaminophen (Tylenol), Serum <10 (L) 10 - 30 ug/mL    Comment:        THERAPEUTIC CONCENTRATIONS VARY SIGNIFICANTLY. A RANGE OF 10-30 ug/mL MAY BE AN EFFECTIVE CONCENTRATION FOR MANY PATIENTS. HOWEVER, SOME ARE BEST TREATED AT CONCENTRATIONS OUTSIDE THIS RANGE. ACETAMINOPHEN CONCENTRATIONS >150 ug/mL AT 4 HOURS AFTER INGESTION AND >50 ug/mL AT 12 HOURS AFTER INGESTION ARE OFTEN ASSOCIATED WITH TOXIC REACTIONS.   CBC     Status: None   Collection Time: 07/13/15  7:42 AM  Result Value Ref Range   WBC 7.1 3.8 - 10.6 K/uL   RBC 5.07 4.40 - 5.90 MIL/uL   Hemoglobin 15.1 13.0 - 18.0 g/dL   HCT 45.1 40.0 - 52.0 %   MCV 89.0 80.0 - 100.0 fL   MCH 29.7 26.0 - 34.0 pg   MCHC 33.4 32.0 - 36.0 g/dL   RDW 13.3 11.5 - 14.5 %   Platelets 238 150 - 440 K/uL  Urine Drug Screen, Qualitative ([facility] only)     Status: Abnormal   Collection Time: 07/13/15  7:42 AM  Result Value Ref Range   Tricyclic, Ur Screen NONE DETECTED NONE DETECTED   Amphetamines, Ur Screen NONE DETECTED NONE DETECTED   MDMA (Ecstasy)Ur Screen NONE DETECTED NONE DETECTED   Cocaine Metabolite,Ur [place] NONE DETECTED NONE DETECTED   Opiate, Ur Screen NONE DETECTED NONE DETECTED   Phencyclidine (PCP) Ur S NONE DETECTED NONE DETECTED   Cannabinoid 50 Ng, Ur [place] POSITIVE (A) NONE DETECTED   Barbiturates, Ur Screen NONE DETECTED NONE DETECTED   Benzodiazepine, Ur Scrn NONE DETECTED NONE DETECTED   Methadone Scn, Ur NONE DETECTED NONE DETECTED    Comment: (NOTE) 970  Tricyclics, urine               Cutoff 1000 ng/mL 200  Amphetamines, urine             Cutoff 1000 ng/mL 300  MDMA (Ecstasy), urine           Cutoff 500 ng/mL 400  Cocaine Metabolite, urine  Cutoff 300 ng/mL 500  Opiate, urine                   Cutoff 300  ng/mL 600  Phencyclidine (PCP), urine      Cutoff 25 ng/mL 700  Cannabinoid, urine              Cutoff 50 ng/mL 800  Barbiturates, urine             Cutoff 200 ng/mL 900  Benzodiazepine, urine           Cutoff 200 ng/mL 1000 Methadone, urine                Cutoff 300 ng/mL 1100 1200 The urine drug screen provides only a preliminary, unconfirmed 1300 analytical test result and should not be used for non-medical 1400 purposes. Clinical consideration and professional judgment should 1500 be applied to any positive drug screen result due to possible 1600 interfering substances. A more specific alternate chemical method 1700 must be used in order to obtain a confirmed analytical result.  1800 Gas chromato graphy / mass spectrometry (GC/MS) is the preferred 1900 confirmatory method.     No current facility-administered medications for this encounter.   Current Outpatient Prescriptions  Medication Sig Dispense Refill  . acetaminophen (TYLENOL) 325 MG tablet Take 650 mg by mouth 3 (three) times daily as needed for mild pain, moderate pain or fever.    Marland Kitchen amLODipine (NORVASC) 10 MG tablet Take 10 mg by mouth daily.    Marland Kitchen aspirin EC 81 MG tablet Take 81 mg by mouth daily.    . bacitracin 500 UNIT/GM ointment Apply 1 application topically 2 (two) times daily as needed.    . Calcium Carbonate-Vitamin D (CALCIUM 600+D) 600-400 MG-UNIT tablet Take 2 tablets by mouth daily.    . carbamazepine (TEGRETOL) 200 MG tablet Take 400 mg by mouth 2 (two) times daily.    . clonazePAM (KLONOPIN) 1 MG tablet Take 0.5-1 mg by mouth 2 (two) times daily. Take 0.72m every morning and take 15mat bedtime for anxiety    . diclofenac (VOLTAREN) 50 MG EC tablet Take 100 mg by mouth 2 (two) times daily as needed for mild pain or moderate pain.    . hydroxypropyl methylcellulose / hypromellose (ISOPTO TEARS / GONIOVISC) 2.5 % ophthalmic solution Place 1 drop into both eyes 2 (two) times daily.    . hydrOXYzine  (ATARAX/VISTARIL) 25 MG tablet Take 25 mg by mouth every evening.    . Marland Kitchenisinopril-hydrochlorothiazide (PRINZIDE,ZESTORETIC) 20-25 MG tablet Take 2 tablets by mouth daily.    . Marland Kitchenoratadine (CLARITIN) 10 MG tablet Take 10 mg by mouth daily as needed for allergies.    . Melatonin 3 MG TABS Take 6 mg by mouth every evening. Between 1800-2000    . Multiple Vitamins-Minerals (MULTIVITAMIN WITH MINERALS) tablet Take 1 tablet by mouth daily.    . Marland Kitchenmeprazole (PRILOSEC) 20 MG capsule Take 20 mg by mouth daily.    . propranolol ER (INDERAL LA) 80 MG 24 hr capsule Take 160 mg by mouth daily.     . QUEtiapine (SEROQUEL) 100 MG tablet Take 100 mg by mouth at bedtime.    . sennosides-docusate sodium (SENOKOT-S) 8.6-50 MG tablet Take 2 tablets by mouth daily. To prevent constipation    . sildenafil (VIAGRA) 100 MG tablet Take 100 mg by mouth daily as needed for erectile dysfunction.    . traZODone (DESYREL) 100 MG tablet Take 150 mg by mouth at bedtime.  Musculoskeletal: Strength & Muscle Tone: within normal limits Gait & Station: normal Patient leans: N/A  Psychiatric Specialty Exam: Review of Systems  Constitutional: Negative.   HENT: Negative.   Eyes: Negative.   Respiratory: Negative.   Cardiovascular: Negative.   Gastrointestinal: Negative.   Musculoskeletal: Negative.   Skin: Negative.   Neurological: Negative.   Psychiatric/Behavioral: Positive for memory loss. Negative for depression, suicidal ideas, hallucinations and substance abuse. The patient is nervous/anxious. The patient does not have insomnia.     Blood pressure 140/75, pulse 79, temperature 97.5 F (36.4 C), temperature source Oral, resp. rate 18, height 5' 7"  (1.702 m), weight 70.308 kg (155 lb), SpO2 97 %.Body mass index is 24.27 kg/(m^2).  General Appearance: Fairly Groomed  Engineer, water::  Poor  Speech:  Normal Rate  Volume:  Normal  Mood:  Dysphoric  Affect:  Constricted  Thought Process:  Tangential  Orientation:   Full (Time, Place, and Person)  Thought Content:  Rumination  Suicidal Thoughts:  No  Homicidal Thoughts:  No  Memory:  Immediate;   Good Recent;   Fair Remote;   Fair  Judgement:  Impaired  Insight:  Present  Psychomotor Activity:  Normal  Concentration:  Poor  Recall:  Poor  Fund of Knowledge:Fair  Language: Fair  Akathisia:  No  Handed:  Right  AIMS (if indicated):     Assets:  Desire for Improvement Financial Resources/Insurance Physical Health Resilience Social Support  ADL's:  Intact  Cognition: Impaired,  Mild  Sleep:      Treatment Plan Summary: Plan This is a 65 year old man with a history of chronic brain injury and chronic cognitive impairment. Unfortunately at this time I don't see that there is anything we can directly do it the hospital to be of benefit to him. I tried to talk him through a more reasonable way to address his problem by getting in contact with the administrators both at the [facility] and [place] and following up with this. Encouraged him to talk with his wife and see if she could be of assistance doing this if he wasn't able to. Patient insisted that none of that was of any benefit at all. He does not however appear to be psychotic and he is not suicidal or homicidal. Does not require hospital level treatment. Patient is to stay on his current medication and follow-up with the [place]. He knows how to access care and knows how to get himself to the [place] if he needs to. Case reviewed with emergency room doctor. No IVC currently filed. He can be released and does not need any new prescriptions.  Disposition: Patient does not meet criteria for psychiatric inpatient admission. Supportive therapy provided about ongoing stressors.  Alethia Berthold, MD 07/13/2015 11:53 AM

## 2015-07-13 NOTE — ED Notes (Signed)
Patient was changed out.

## 2015-07-13 NOTE — ED Notes (Signed)
Labs and urine collected and sent to lab

## 2015-07-13 NOTE — ED Notes (Signed)
Pt given lunch tray.

## 2015-11-30 DIAGNOSIS — H25813 Combined forms of age-related cataract, bilateral: Secondary | ICD-10-CM | POA: Diagnosis not present

## 2016-01-15 DIAGNOSIS — Z23 Encounter for immunization: Secondary | ICD-10-CM | POA: Diagnosis not present

## 2016-08-30 ENCOUNTER — Emergency Department
Admission: EM | Admit: 2016-08-30 | Discharge: 2016-08-30 | Disposition: A | Discharge status: Home / Self Care | Payer: Medicare Other | Attending: Emergency Medicine | Admitting: Emergency Medicine

## 2016-08-30 DIAGNOSIS — Z7982 Long term (current) use of aspirin: Secondary | ICD-10-CM | POA: Insufficient documentation

## 2016-08-30 DIAGNOSIS — R1031 Right lower quadrant pain: Secondary | ICD-10-CM | POA: Insufficient documentation

## 2016-08-30 DIAGNOSIS — Z5321 Procedure and treatment not carried out due to patient leaving prior to being seen by health care provider: Secondary | ICD-10-CM | POA: Diagnosis not present

## 2016-08-30 DIAGNOSIS — Z79899 Other long term (current) drug therapy: Secondary | ICD-10-CM | POA: Insufficient documentation

## 2016-08-30 DIAGNOSIS — F17200 Nicotine dependence, unspecified, uncomplicated: Secondary | ICD-10-CM | POA: Diagnosis not present

## 2016-08-30 DIAGNOSIS — I10 Essential (primary) hypertension: Secondary | ICD-10-CM | POA: Insufficient documentation

## 2016-08-30 LAB — COMPREHENSIVE METABOLIC PANEL
ALT: 19 U/L (ref 17–63)
AST: 26 U/L (ref 15–41)
Albumin: 4.2 g/dL (ref 3.5–5.0)
Alkaline Phosphatase: 54 U/L (ref 38–126)
Anion gap: 7 (ref 5–15)
BUN: 13 mg/dL (ref 6–20)
CO2: 24 mmol/L (ref 22–32)
CREATININE: 1 mg/dL (ref 0.61–1.24)
Calcium: 9.1 mg/dL (ref 8.9–10.3)
Chloride: 96 mmol/L — ABNORMAL LOW (ref 101–111)
GFR calc Af Amer: >60 mL/min (ref >60)
GFR calc non Af Amer: >60 mL/min (ref >60)
GLUCOSE: 100 mg/dL — AB (ref 65–99)
Potassium: 3.6 mmol/L (ref 3.5–5.1)
SODIUM: 127 mmol/L — AB (ref 135–145)
Total Bilirubin: 0.9 mg/dL (ref 0.3–1.2)
Total Protein: 7.1 g/dL (ref 6.5–8.1)

## 2016-08-30 LAB — CBC
HCT: 43.5 % (ref 40.0–52.0)
Hemoglobin: 15.4 g/dL (ref 13.0–18.0)
MCH: 33.7 pg (ref 26.0–34.0)
MCHC: 35.4 g/dL (ref 32.0–36.0)
MCV: 95.1 fL (ref 80.0–100.0)
PLATELETS: 196 10*3/uL (ref 150–440)
RBC: 4.57 MIL/uL (ref 4.40–5.90)
RDW: 12.4 % (ref 11.5–14.5)
WBC: 7.1 10*3/uL (ref 3.8–10.6)

## 2016-08-30 LAB — URINALYSIS, COMPLETE (UACMP) WITH MICROSCOPIC
Bacteria, UA: NONE SEEN
Bilirubin Urine: NEGATIVE
GLUCOSE, UA: NEGATIVE mg/dL
Hgb urine dipstick: NEGATIVE
Ketones, ur: NEGATIVE mg/dL
Leukocytes, UA: NEGATIVE
Nitrite: NEGATIVE
PH: 6 (ref 5.0–8.0)
PROTEIN: NEGATIVE mg/dL
RBC / HPF: NONE SEEN RBC/hpf (ref 0–5)
SQUAMOUS EPITHELIAL / LPF: NONE SEEN
Specific Gravity, Urine: 1.002 — ABNORMAL LOW (ref 1.005–1.030)
WBC, UA: NONE SEEN WBC/hpf (ref 0–5)

## 2016-08-30 LAB — LIPASE, BLOOD: LIPASE: 32 U/L (ref 11–51)

## 2016-08-30 NOTE — ED Notes (Signed)
Pt called to be roomed from lobby. No answer.

## 2016-08-30 NOTE — ED Triage Notes (Signed)
PT arrives to ER via POV c/o right sided groin pain and swelling X 2 days. PT hx of hernia on left side, but not right. Pt alert and oriented X4, active, cooperative, pt in NAD. RR even and unlabored, color WNL.

## 2016-08-30 NOTE — ED Notes (Signed)
Pt called from lobby to be roomed without answer.

## 2016-10-30 ENCOUNTER — Encounter: Payer: Self-pay | Admitting: Family

## 2016-10-30 ENCOUNTER — Ambulatory Visit: Payer: Medicare Other | Admitting: Family Medicine

## 2016-10-30 ENCOUNTER — Ambulatory Visit (INDEPENDENT_AMBULATORY_CARE_PROVIDER_SITE_OTHER): Payer: Medicare Other | Admitting: Family

## 2016-10-30 VITALS — BP 128/70 | HR 68 | Temp 97.9°F | Ht 68.0 in | Wt 149.6 lb

## 2016-10-30 DIAGNOSIS — F332 Major depressive disorder, recurrent severe without psychotic features: Secondary | ICD-10-CM | POA: Diagnosis not present

## 2016-10-30 DIAGNOSIS — I159 Secondary hypertension, unspecified: Secondary | ICD-10-CM

## 2016-10-30 DIAGNOSIS — K469 Unspecified abdominal hernia without obstruction or gangrene: Secondary | ICD-10-CM | POA: Diagnosis not present

## 2016-10-30 HISTORY — DX: Unspecified abdominal hernia without obstruction or gangrene: K46.9

## 2016-10-30 NOTE — Assessment & Plan Note (Signed)
Continue current regimen for now. Blood pressure at goal. Pending CMP

## 2016-10-30 NOTE — Progress Notes (Signed)
Pre visit review using our clinic review tool, if applicable. No additional management support is needed unless otherwise documented below in the visit note. 

## 2016-10-30 NOTE — Assessment & Plan Note (Addendum)
Able to see bulge right groin. Working diagnosis of hernia. No evidence of bowel incarceration. Nontender. Patient is having abdominal pain Pending groin ultrasound. After which, we'll pursue consult with surgery.

## 2016-10-30 NOTE — Assessment & Plan Note (Signed)
Very concerned as the patient has not had recent follow-up with psychiatry. He states he's no longer follows with psychiatrist from the [place]. Information for walk-in psychiatrist [facility] given. Suicide contract in place. Follow up in one month, sooner if needed.

## 2016-10-30 NOTE — Patient Instructions (Addendum)
Very concerned that you no longer see Dr Laurena Slimmer, psychiatry.  We also will  Order an ultrasound to evaluate for swelling.   Please go this week to establish with :  I would like for you to go to [facility] services anytime between 8am-3pm.  They have counseling and psychiatrists there who can see you right away.  They have walk ins on Monday, Wednesdays, and Fridays.  The address is [place], [place] [address].  Phone number is [phone].   They are near [facility] and [facility].   Please know that I am thinking about you.

## 2016-10-30 NOTE — Progress Notes (Signed)
Subjective:    Patient ID: Carl Ortega, male    DOB: 04-09-1950, 66 y.o.   MRN: [id number]  CC: Carl Ortega is a 66 y.o. male who presents today for acute visit.   HPI:  No prior pcp.   CC: RLQ swelling, month, unchanged.  Went to ED and wasn't seen.  No pain. Regular BM. No problems urinating. No abdominal pain or fever.   Hasn't lifted anything heavy.  H/o hernia repair on left side  Depression - prior psychiatrist had been [place].  On seroquel; notes he has been on off seroquel for one week however it has been refilled by [place].  States takes seroquel. To help with sleep. Notes having thoughts years ago of committing suicide in the past 'many years ago' , no thoughts of hurting himself or anyone else at this time. Marland Kitchen     HISTORY:  Past Medical History:  Diagnosis Date  . Bipolar 1 disorder ([place])   . Brain injury ([place])   . Hypertension   . PTSD (post-traumatic stress disorder)    Past Surgical History:  Procedure Laterality Date  . HERNIA REPAIR     left groin  . none     Family History  Problem Relation Age of Onset  . Hypertension Unknown     Allergies: Ketorolac Current Outpatient Prescriptions on File Prior to Visit  Medication Sig Dispense Refill  . acetaminophen (TYLENOL) 325 MG tablet Take 650 mg by mouth 3 (three) times daily as needed for mild pain, moderate pain or fever.    Marland Kitchen amLODipine (NORVASC) 10 MG tablet Take 10 mg by mouth daily.    Marland Kitchen aspirin EC 81 MG tablet Take 81 mg by mouth daily.    . bacitracin 500 UNIT/GM ointment Apply 1 application topically 2 (two) times daily as needed.    . Calcium Carbonate-Vitamin D (CALCIUM 600+D) 600-400 MG-UNIT tablet Take 2 tablets by mouth daily.    . diclofenac (VOLTAREN) 50 MG EC tablet Take 100 mg by mouth 2 (two) times daily as needed for mild pain or moderate pain.    . hydroxypropyl methylcellulose / hypromellose (ISOPTO TEARS / GONIOVISC) 2.5 % ophthalmic solution Place 1 drop into both eyes 2 (two)  times daily.    . hydrOXYzine (ATARAX/VISTARIL) 25 MG tablet Take 25 mg by mouth every evening.    Marland Kitchen lisinopril-hydrochlorothiazide (PRINZIDE,ZESTORETIC) 20-25 MG tablet Take 2 tablets by mouth daily.    Marland Kitchen loratadine (CLARITIN) 10 MG tablet Take 10 mg by mouth daily as needed for allergies.    . Melatonin 3 MG TABS Take 6 mg by mouth every evening. Between 1800-2000    . Multiple Vitamins-Minerals (MULTIVITAMIN WITH MINERALS) tablet Take 1 tablet by mouth daily.    Marland Kitchen omeprazole (PRILOSEC) 20 MG capsule Take 20 mg by mouth daily.    . propranolol ER (INDERAL LA) 80 MG 24 hr capsule Take 160 mg by mouth daily.     . QUEtiapine (SEROQUEL) 100 MG tablet Take 100 mg by mouth at bedtime.    . sennosides-docusate sodium (SENOKOT-S) 8.6-50 MG tablet Take 2 tablets by mouth daily. To prevent constipation     No current facility-administered medications on file prior to visit.     Social History  Substance Use Topics  . Smoking status: Current Every Day Smoker  . Smokeless tobacco: Not on file  . Alcohol use 2.4 oz/week    4 Standard drinks or equivalent per week    Review of Systems  Constitutional: Negative  for chills and fever.  Respiratory: Negative for cough.   Cardiovascular: Negative for chest pain and palpitations.  Gastrointestinal: Negative for nausea and vomiting.  Psychiatric/Behavioral: Negative for suicidal ideas.      Objective:    BP 128/70   Pulse 68   Temp 97.9 F (36.6 C) (Oral)   Ht 5\' 8"  (1.727 m)   Wt 149 lb 9.6 oz (67.9 kg)   SpO2 98%   BMI 22.75 kg/m  BP Readings from Last 3 Encounters:  10/30/16 128/70  08/30/16 121/81  07/13/15 (!) 138/95   Wt Readings from Last 3 Encounters:  10/30/16 149 lb 9.6 oz (67.9 kg)  08/30/16 155 lb (70.3 kg)  07/13/15 155 lb (70.3 kg)    Physical Exam  Constitutional: He appears well-developed and well-nourished.  Cardiovascular: Regular rhythm and normal heart sounds.   Pulmonary/Chest: Effort normal and breath sounds  normal. No respiratory distress. He has no wheezes. He has no rales.  Abdominal: Soft. Normal appearance and bowel sounds are normal. He exhibits no distension, no fluid wave, no ascites and no mass. There is no tenderness. There is no rigidity, no rebound, no guarding, no CVA tenderness, no tenderness at McBurney's point and negative Murphy's sign.    Noticeable asymmetry. With cough, slightly enlarges. Reducible. Non tender. Skin intact. No erythema, streaking.   Neurological: He is alert.  Skin: Skin is warm and dry.  Psychiatric: He has a normal mood and affect. His speech is normal and behavior is normal.  Vitals reviewed.      Assessment & Plan:   Problem List Items Addressed This Visit      Cardiovascular and Mediastinum   Hypertension    Continue current regimen for now. Blood pressure at goal. Pending CMP      Relevant Orders   Comprehensive metabolic panel     Other   MDD (major depressive disorder), recurrent severe, without psychosis ([place]) (Chronic)    Very concerned as the patient has not had recent follow-up with psychiatry. He states he's no longer follows with psychiatrist from the [place]. Information for walk-in psychiatrist [facility] given. Suicide contract in place. Follow up in one month, sooner if needed.      Hernia of abdominal cavity - Primary    Able to see bulge right groin. Working diagnosis of hernia. No evidence of bowel incarceration. Nontender. Patient is having abdominal pain Pending groin ultrasound. After which, we'll pursue consult with surgery.       Relevant Orders   US Abdomen Limited       I have discontinued Mr. Bradburn carbamazepine, clonazePAM, sildenafil, and traZODone. I am also having him maintain his omeprazole, sennosides-docusate sodium, multivitamin with minerals, propranolol ER, amLODipine, loratadine, aspirin EC, Calcium Carbonate-Vitamin D, acetaminophen, hydrOXYzine, QUEtiapine, Melatonin, hydroxypropyl methylcellulose /  hypromellose, bacitracin, diclofenac, and lisinopril-hydrochlorothiazide.   No orders of the defined types were placed in this encounter.   Return precautions given.   Risks, benefits, and alternatives of the medications and treatment plan prescribed today were discussed, and patient expressed understanding.   Education regarding symptom management and diagnosis given to patient on AVS.  Continue to follow with Burnard Hawthorne, FNP for routine health maintenance.   Andria Rhein and I agreed with plan.   Mable Paris, FNP

## 2016-10-31 LAB — COMPREHENSIVE METABOLIC PANEL
ALT: 24 U/L (ref 0–53)
AST: 28 U/L (ref 0–37)
Albumin: 4.7 g/dL (ref 3.5–5.2)
Alkaline Phosphatase: 48 U/L (ref 39–117)
BILIRUBIN TOTAL: 0.9 mg/dL (ref 0.2–1.2)
BUN: 12 mg/dL (ref 6–23)
CHLORIDE: 92 meq/L — AB (ref 96–112)
CO2: 28 meq/L (ref 19–32)
Calcium: 9.9 mg/dL (ref 8.4–10.5)
Creatinine, Ser: 0.99 mg/dL (ref 0.40–1.50)
GFR: 80.31 mL/min (ref >60.00)
GLUCOSE: 85 mg/dL (ref 70–99)
POTASSIUM: 3.8 meq/L (ref 3.5–5.1)
SODIUM: 128 meq/L — AB (ref 135–145)
Total Protein: 7.6 g/dL (ref 6.0–8.3)

## 2016-11-03 ENCOUNTER — Telehealth: Payer: Self-pay | Admitting: Family

## 2016-11-03 NOTE — Telephone Encounter (Signed)
lvm for pt to rtc. Abdominal ultrasound scheduled for 8/16 @ 845 am. Arrive @ 830 for check in. Npo after midnight. [id number], [place]. [phone] to r/s

## 2016-11-09 ENCOUNTER — Ambulatory Visit
Admission: RE | Admit: 2016-11-09 | Discharge: 2016-11-09 | Disposition: A | Discharge status: Home / Self Care | Payer: Medicare Other | Source: Ambulatory Visit | Attending: Family | Admitting: Family

## 2016-11-09 ENCOUNTER — Other Ambulatory Visit: Payer: Self-pay | Admitting: Family

## 2016-11-09 DIAGNOSIS — K4090 Unilateral inguinal hernia, without obstruction or gangrene, not specified as recurrent: Secondary | ICD-10-CM | POA: Diagnosis not present

## 2016-11-09 DIAGNOSIS — K469 Unspecified abdominal hernia without obstruction or gangrene: Secondary | ICD-10-CM | POA: Diagnosis present

## 2016-11-09 DIAGNOSIS — R1909 Other intra-abdominal and pelvic swelling, mass and lump: Secondary | ICD-10-CM | POA: Diagnosis not present

## 2016-11-13 ENCOUNTER — Other Ambulatory Visit: Payer: Self-pay | Admitting: Family

## 2016-11-13 DIAGNOSIS — K469 Unspecified abdominal hernia without obstruction or gangrene: Secondary | ICD-10-CM

## 2016-11-21 ENCOUNTER — Telehealth: Payer: Self-pay | Admitting: Family

## 2016-11-21 NOTE — Telephone Encounter (Signed)
Pt wife called to follow up on pt results from [place]. Thank you!  Call wife @ [phone].

## 2016-11-23 NOTE — Telephone Encounter (Signed)
Spoke to patients wife she was informing us he will call to let us know his next appointment,

## 2016-12-01 ENCOUNTER — Encounter: Payer: Self-pay | Admitting: General Surgery

## 2016-12-11 ENCOUNTER — Telehealth: Payer: Self-pay | Admitting: *Deleted

## 2016-12-11 NOTE — Telephone Encounter (Signed)
Left message for patient to return call back.  

## 2016-12-11 NOTE — Telephone Encounter (Signed)
Pt requested a call in reference to labs Pt contact [phone]

## 2016-12-14 NOTE — Telephone Encounter (Signed)
Closing note due to patient not answering or returning call back.

## 2016-12-14 NOTE — Telephone Encounter (Signed)
Left message for patient to return call back.  

## 2016-12-27 ENCOUNTER — Encounter: Payer: Self-pay | Admitting: *Deleted

## 2016-12-28 ENCOUNTER — Encounter: Payer: Self-pay | Admitting: General Surgery

## 2016-12-28 ENCOUNTER — Ambulatory Visit (INDEPENDENT_AMBULATORY_CARE_PROVIDER_SITE_OTHER): Payer: Medicare Other | Admitting: General Surgery

## 2016-12-28 VITALS — BP 124/74 | HR 84 | Resp 14 | Ht 70.0 in | Wt 152.0 lb

## 2016-12-28 DIAGNOSIS — K4090 Unilateral inguinal hernia, without obstruction or gangrene, not specified as recurrent: Secondary | ICD-10-CM | POA: Diagnosis not present

## 2016-12-28 DIAGNOSIS — Z1211 Encounter for screening for malignant neoplasm of colon: Secondary | ICD-10-CM | POA: Diagnosis not present

## 2016-12-28 MED ORDER — POLYETHYLENE GLYCOL 3350 17 GM/SCOOP PO POWD: ORAL | 0 refills | Status: DC

## 2016-12-28 NOTE — Patient Instructions (Addendum)
Inguinal Hernia, Adult An inguinal hernia is when fat or the intestines push through the area where the leg meets the lower abdomen (groin) and create a rounded lump (bulge). This condition develops over time. There are three types of inguinal hernias. These types include:  Hernias that can be pushed back into the belly (are reducible).  Hernias that are not reducible (are incarcerated).  Hernias that are not reducible and lose their blood supply (are strangulated). This type of hernia requires emergency surgery.  What are the causes? This condition is caused by having a weak spot in the muscles or tissue. This weakness lets the hernia poke through. This condition can be triggered by:  Suddenly straining the muscles of the lower abdomen.  Lifting heavy objects.  Straining to have a bowel movement. Difficult bowel movements (constipation) can lead to this.  Coughing.  What increases the risk? This condition is more likely to develop in:  Men.  Pregnant women.  People who: ? Are overweight. ? Work in jobs that require long periods of standing or heavy lifting. ? Have had an inguinal hernia before. ? Smoke or have lung disease. These factors can lead to long-lasting (chronic) coughing.  What are the signs or symptoms? Symptoms can depend on the size of the hernia. Often, a small inguinal hernia has no symptoms. Symptoms of a larger hernia include:  A lump in the groin. This is easier to see when the person is standing. It might not be visible when he or she is lying down.  Pain or burning in the groin. This occurs especially when lifting, straining, or coughing.  A dull ache or a feeling of pressure in the groin.  A lump in the scrotum in men.  Symptoms of a strangulated inguinal hernia can include:  A bulge in the groin that is very painful and tender to the touch.  A bulge that turns red or purple.  Fever, nausea, and vomiting.  The inability to have a bowel  movement or to pass gas.  How is this diagnosed? This condition is diagnosed with a medical history and physical exam. Your health care provider may feel your groin area and ask you to cough. How is this treated? Treatment for this condition varies depending on the size of your hernia and whether you have symptoms. If you do not have symptoms, your health care provider may have you watch your hernia carefully and come in for follow-up visits. If your hernia is larger or if you have symptoms, your treatment will include surgery. Follow these instructions at home: Lifestyle  Drink enough fluid to keep your urine clear or pale yellow.  Eat a diet that includes a lot of fiber. Eat plenty of fruits, vegetables, and whole grains. Talk with your health care provider if you have questions.  Avoid lifting heavy objects.  Avoid standing for long periods of time.  Do not use tobacco products, including cigarettes, chewing tobacco, or e-cigarettes. If you need help quitting, ask your health care provider.  Maintain a healthy weight. General instructions  Do not try to force the hernia back in.  Watch your hernia for any changes in color or size. Let your health care provider know if any changes occur.  Take over-the-counter and prescription medicines only as told by your health care provider.  Keep all follow-up visits as told by your health care provider. This is important. Contact a health care provider if:  You have a fever.  You have new   symptoms.  Your symptoms get worse. Get help right away if:  You have pain in the groin that suddenly gets worse.  A bulge in the groin gets bigger suddenly and does not go down.  You are a man and you have a sudden pain in the scrotum, or the size of your scrotum suddenly changes.  A bulge in the groin area becomes red or purple and is painful to the touch.  You have nausea or vomiting that does not go away.  You feel your heart beating a lot  more quickly than normal.  You cannot have a bowel movement or pass gas. This information is not intended to replace advice given to you by your health care provider. Make sure you discuss any questions you have with your health care provider. Document Released: 07/30/2008 Document Revised: 08/19/2015 Document Reviewed: 01/21/2014 Elsevier Interactive Patient Education  2018 [employer].   Colonoscopy, Adult A colonoscopy is an exam to look at the entire large intestine. During the exam, a lubricated, bendable tube is inserted into the anus and then passed into the rectum, colon, and other parts of the large intestine. A colonoscopy is often done as a part of normal colorectal screening or in response to certain symptoms, such as anemia, persistent diarrhea, abdominal pain, and blood in the stool. The exam can help screen for and diagnose medical problems, including:  Tumors.  Polyps.  Inflammation.  Areas of bleeding.  Tell a health care provider about:  Any allergies you have.  All medicines you are taking, including vitamins, herbs, eye drops, creams, and over-the-counter medicines.  Any problems you or family members have had with anesthetic medicines.  Any blood disorders you have.  Any surgeries you have had.  Any medical conditions you have.  Any problems you have had passing stool. What are the risks? Generally, this is a safe procedure. However, problems may occur, including:  Bleeding.  A tear in the intestine.  A reaction to medicines given during the exam.  Infection (rare).  What happens before the procedure? Eating and drinking restrictions Follow instructions from your health care provider about eating and drinking, which may include:  A few days before the procedure - follow a low-fiber diet. Avoid nuts, seeds, dried fruit, raw fruits, and vegetables.  1-3 days before the procedure - follow a clear liquid diet. Drink only clear liquids, such as  clear broth or bouillon, black coffee or tea, clear juice, clear soft drinks or sports drinks, gelatin dessert, and popsicles. Avoid any liquids that contain red or purple dye.  On the day of the procedure - do not eat or drink anything during the 2 hours before the procedure, or within the time period that your health care provider recommends.  Bowel prep If you were prescribed an oral bowel prep to clean out your colon:  Take it as told by your health care provider. Starting the day before your procedure, you will need to drink a large amount of medicated liquid. The liquid will cause you to have multiple loose stools until your stool is almost clear or light green.  If your skin or anus gets irritated from diarrhea, you may use these to relieve the irritation: ? Medicated wipes, such as adult wet wipes with aloe and vitamin E. ? A skin soothing-product like petroleum jelly.  If you vomit while drinking the bowel prep, take a break for up to 60 minutes and then begin the bowel prep again. If vomiting continues  and you cannot take the bowel prep without vomiting, call your health care provider.  General instructions  Ask your health care provider about changing or stopping your regular medicines. This is especially important if you are taking diabetes medicines or blood thinners.  Plan to have someone take you home from the hospital or clinic. What happens during the procedure?  An IV tube may be inserted into one of your veins.  You will be given medicine to help you relax (sedative).  To reduce your risk of infection: ? Your health care team will wash or sanitize their hands. ? Your anal area will be washed with soap.  You will be asked to lie on your side with your knees bent.  Your health care provider will lubricate a long, thin, flexible tube. The tube will have a camera and a light on the end.  The tube will be inserted into your anus.  The tube will be gently eased  through your rectum and colon.  Air will be delivered into your colon to keep it open. You may feel some pressure or cramping.  The camera will be used to take images during the procedure.  A small tissue sample may be removed from your body to be examined under a microscope (biopsy). If any potential problems are found, the tissue will be sent to a lab for testing.  If small polyps are found, your health care provider may remove them and have them checked for cancer cells.  The tube that was inserted into your anus will be slowly removed. The procedure may vary among health care providers and hospitals. What happens after the procedure?  Your blood pressure, heart rate, breathing rate, and blood oxygen level will be monitored until the medicines you were given have worn off.  Do not drive for 24 hours after the exam.  You may have a small amount of blood in your stool.  You may pass gas and have mild abdominal cramping or bloating due to the air that was used to inflate your colon during the exam.  It is up to you to get the results of your procedure. Ask your health care provider, or the department performing the procedure, when your results will be ready. This information is not intended to replace advice given to you by your health care provider. Make sure you discuss any questions you have with your health care provider. Document Released: 03/10/2000 Document Revised: 01/12/2016 Document Reviewed: 05/25/2015 Elsevier Interactive Patient Education  2018 [employer].

## 2016-12-28 NOTE — Progress Notes (Signed)
Patient ID: Carl Ortega, male   DOB: 1951/03/22, 66 y.o.   MRN: [id number]  Chief Complaint  Patient presents with  . Other    HPI Carl Ortega is a 66 y.o. male here today for a evaluation of a right inguinal hernia. Patient noticed this area about two months ago. No pain or change. Patient had a ultrasound done on 11/09/2016. He had a left inguinal hernia repair years ago. Current smoker and a history of marijuana and cocaine use. He states he has not used illicit drugs in "a long time". He receives his medical care mostly  through the [place] and does not have a PCP. He is retired and lives at home with his wife and does not routinely engage in strenuous exercise.   HPI  Past Medical History:  Diagnosis Date  . Bipolar 1 disorder ([place])   . Brain injury ([place])   . Hypertension   . PTSD (post-traumatic stress disorder)     Past Surgical History:  Procedure Laterality Date  . HERNIA REPAIR  2010   left groin  . none      Family History  Problem Relation Age of Onset  . Hypertension Unknown     Social History Social History  Substance Use Topics  . Smoking status: Current Every Day Smoker  . Smokeless tobacco: Never Used  . Alcohol use 2.4 oz/week    4 Standard drinks or equivalent per week    Allergies  Allergen Reactions  . Ketorolac Rash    Current Outpatient Prescriptions  Medication Sig Dispense Refill  . acetaminophen (TYLENOL) 325 MG tablet Take 650 mg by mouth 3 (three) times daily as needed for mild pain, moderate pain or fever.    Marland Kitchen amLODipine (NORVASC) 10 MG tablet Take 10 mg by mouth daily.    Marland Kitchen aspirin EC 81 MG tablet Take 81 mg by mouth daily.    . Calcium Carbonate-Vitamin D (CALCIUM 600+D) 600-400 MG-UNIT tablet Take 2 tablets by mouth daily.    . diclofenac (VOLTAREN) 50 MG EC tablet Take 100 mg by mouth 2 (two) times daily as needed for mild pain or moderate pain.    . hydroxypropyl methylcellulose / hypromellose (ISOPTO TEARS / GONIOVISC) 2.5 %  ophthalmic solution Place 1 drop into both eyes 2 (two) times daily.    . hydrOXYzine (ATARAX/VISTARIL) 25 MG tablet Take 25 mg by mouth every evening.    Marland Kitchen lisinopril-hydrochlorothiazide (PRINZIDE,ZESTORETIC) 20-25 MG tablet Take 2 tablets by mouth daily.    Marland Kitchen loratadine (CLARITIN) 10 MG tablet Take 10 mg by mouth daily as needed for allergies.    . Melatonin 3 MG TABS Take 6 mg by mouth every evening. Between 1800-2000    . Multiple Vitamins-Minerals (MULTIVITAMIN WITH MINERALS) tablet Take 1 tablet by mouth daily.    Marland Kitchen omeprazole (PRILOSEC) 20 MG capsule Take 20 mg by mouth daily.    . propranolol ER (INDERAL LA) 80 MG 24 hr capsule Take 160 mg by mouth daily.     . QUEtiapine (SEROQUEL) 100 MG tablet Take 100 mg by mouth at bedtime.    . sennosides-docusate sodium (SENOKOT-S) 8.6-50 MG tablet Take 2 tablets by mouth daily. To prevent constipation    . polyethylene glycol powder (GLYCOLAX/MIRALAX) powder 255 grams one bottle for colonoscopy prep 255 g 0   No current facility-administered medications for this visit.     Review of Systems Review of Systems  Constitutional: Negative.   Respiratory: Negative.   Cardiovascular: Negative.  Blood pressure 124/74, pulse 84, resp. rate 14, height 5\' 10"  (1.778 m), weight 152 lb (68.9 kg).  Physical Exam Physical Exam  Constitutional: He is oriented to person, place, and time. He appears well-developed and well-nourished.  Eyes: Conjunctivae are normal. No scleral icterus.  Neck: Neck supple.  Cardiovascular: Normal rate, regular rhythm and normal heart sounds.   Pulmonary/Chest: Effort normal and breath sounds normal.  Abdominal: Soft. Normal appearance and bowel sounds are normal. There is no hepatomegaly. There is no tenderness. A hernia is present. Hernia confirmed positive in the right inguinal area.    Lymphadenopathy:    He has no cervical adenopathy.  Neurological: He is alert and oriented to person, place, and time.  Skin:  Skin is warm and dry.    Data Reviewed Notes reviewed   Assessment     Right inguinal hernia - prominent medium sized and reducible on physical exam today. He had a left inguinal hernia repair several years ago. Given the hernia is symptomatic, will recommend for hernia repair and patient agreed. Given that patient has not had a colonoscopy or stool test in the last 10 years and does not know his family's colon cancer history, counseled patient on scheduling for a colonoscopy to screen for any polyps prior to hernia repair. He was also counseled on the importance of abstaining from any illicit drug use, its intraoperative risks, and acknowledges that he knows he will be tested the day of the operation.   Plan    Will schedule for colonoscopy prior to hernia repair to evaluate for anything else of concern before operation.    Colonoscopy with possible biopsy/polypectomy prn: Information regarding the procedure, including its potential risks and complications (including but not limited to perforation of the bowel, which may require emergency surgery to repair, and bleeding) was verbally given to the patient. Educational information regarding lower intestinal endoscopy was given to the patient. Written instructions for how to complete the bowel prep using Miralax were provided. The importance of drinking ample fluids to avoid dehydration as a result of the prep emphasized.  Hernia precautions and incarceration were discussed with the patient. If they develop symptoms of an incarcerated hernia, they were encouraged to seek prompt medical attention.  I have recommended repair of the hernia using mesh on an outpatient basis in the near future. The risk of infection was reviewed. The role of prosthetic mesh to minimize the risk of recurrence was reviewed.    HPI, Physical Exam, Assessment and Plan have been scribed under the direction and in the presence of Mckinley Jewel, MD  Gaspar Cola,  CMA   I have completed the exam and reviewed the above documentation for accuracy and completeness.  I agree with the above.  Haematologist has been used and any errors in dictation or transcription are unintentional.  Seeplaputhur G. Jamal Collin, M.D., F.A.C.S.   Junie Panning G 12/28/2016, 10:31 AM  Patient has been scheduled for a colonoscopy on 01-10-17 at [facility]. Miralax prescription has been sent in to the patient's pharmacy today. Colonoscopy instructions have been reviewed with the patient. This patient is aware to call the office if they have further questions. It is okay for patient to continue an 81 mg aspirin once daily.    Patient's hernia surgery has been scheduled for 01-16-17 at [facility]. It is okay for patient to continue an 81 mg aspirin once daily.     Dominga Ferry, CMA

## 2016-12-29 ENCOUNTER — Telehealth: Payer: Self-pay

## 2016-12-29 NOTE — Telephone Encounter (Signed)
Message left for the patient to call back for his pre admit date and time. He will pre admit at the hospital on 01/08/17 at 10:30 am.

## 2016-12-29 NOTE — Telephone Encounter (Signed)
-----   Message from Dominga Ferry, [place] sent at 12/28/2016  5:06 PM EDT ----- Please call patient will pre-admit date and time once the hospital schedules. Thanks.

## 2017-01-01 ENCOUNTER — Telehealth: Payer: Self-pay | Admitting: *Deleted

## 2017-01-01 NOTE — Telephone Encounter (Signed)
Called patient but no answer. Message left with Pre-admission date, time, and instructions.   He was instructed to call the office should he have further questions.

## 2017-01-01 NOTE — Telephone Encounter (Signed)
Message left for patient to call the office.   We need to inform him of pre-admission date and time.

## 2017-01-08 ENCOUNTER — Inpatient Hospital Stay: Admission: RE | Admit: 2017-01-08 | Payer: Self-pay | Source: Ambulatory Visit

## 2017-01-09 MED ORDER — CEFAZOLIN SODIUM-DEXTROSE 2-4 GM/100ML-% IV SOLN: 2.0000 g | INTRAVENOUS | Status: DC

## 2017-01-10 ENCOUNTER — Ambulatory Visit: Payer: Medicare Other | Admitting: Anesthesiology

## 2017-01-10 ENCOUNTER — Encounter: Payer: Self-pay | Admitting: *Deleted

## 2017-01-10 ENCOUNTER — Encounter
Admission: RE | Disposition: A | Discharge status: Home / Self Care | Payer: Self-pay | Source: Ambulatory Visit | Attending: General Surgery

## 2017-01-10 ENCOUNTER — Ambulatory Visit
Admission: RE | Admit: 2017-01-10 | Discharge: 2017-01-10 | Disposition: A | Discharge status: Home / Self Care | Payer: Medicare Other | Source: Ambulatory Visit | Attending: General Surgery | Admitting: General Surgery

## 2017-01-10 DIAGNOSIS — F319 Bipolar disorder, unspecified: Secondary | ICD-10-CM | POA: Diagnosis not present

## 2017-01-10 DIAGNOSIS — J449 Chronic obstructive pulmonary disease, unspecified: Secondary | ICD-10-CM | POA: Insufficient documentation

## 2017-01-10 DIAGNOSIS — K4090 Unilateral inguinal hernia, without obstruction or gangrene, not specified as recurrent: Secondary | ICD-10-CM | POA: Insufficient documentation

## 2017-01-10 DIAGNOSIS — Z7982 Long term (current) use of aspirin: Secondary | ICD-10-CM | POA: Diagnosis not present

## 2017-01-10 DIAGNOSIS — Z888 Allergy status to other drugs, medicaments and biological substances status: Secondary | ICD-10-CM | POA: Insufficient documentation

## 2017-01-10 DIAGNOSIS — F4310 Post-traumatic stress disorder, unspecified: Secondary | ICD-10-CM | POA: Insufficient documentation

## 2017-01-10 DIAGNOSIS — Z1211 Encounter for screening for malignant neoplasm of colon: Secondary | ICD-10-CM | POA: Diagnosis not present

## 2017-01-10 DIAGNOSIS — Z791 Long term (current) use of non-steroidal anti-inflammatories (NSAID): Secondary | ICD-10-CM | POA: Diagnosis not present

## 2017-01-10 DIAGNOSIS — F418 Other specified anxiety disorders: Secondary | ICD-10-CM | POA: Diagnosis not present

## 2017-01-10 DIAGNOSIS — F17210 Nicotine dependence, cigarettes, uncomplicated: Secondary | ICD-10-CM | POA: Diagnosis not present

## 2017-01-10 DIAGNOSIS — F419 Anxiety disorder, unspecified: Secondary | ICD-10-CM | POA: Diagnosis not present

## 2017-01-10 DIAGNOSIS — I10 Essential (primary) hypertension: Secondary | ICD-10-CM | POA: Diagnosis not present

## 2017-01-10 HISTORY — PX: COLONOSCOPY WITH PROPOFOL: SHX5780

## 2017-01-10 SURGERY — COLONOSCOPY WITH PROPOFOL
Anesthesia: General

## 2017-01-10 MED ORDER — PROPOFOL 10 MG/ML IV BOLUS
INTRAVENOUS | Status: DC | PRN
  Administered 2017-01-10: 20 mg via INTRAVENOUS
  Administered 2017-01-10: 30 mg via INTRAVENOUS

## 2017-01-10 MED ORDER — PROPOFOL 10 MG/ML IV BOLUS
INTRAVENOUS | Status: AC
  Filled 2017-01-10: qty 20

## 2017-01-10 MED ORDER — SODIUM CHLORIDE 0.9 % IV SOLN
INTRAVENOUS | Status: DC
  Administered 2017-01-10: 10:00:00 via INTRAVENOUS

## 2017-01-10 MED ORDER — PROPOFOL 500 MG/50ML IV EMUL
INTRAVENOUS | Status: DC | PRN
  Administered 2017-01-10: 120 ug/kg/min via INTRAVENOUS

## 2017-01-10 MED ORDER — MIDAZOLAM HCL 2 MG/2ML IJ SOLN
INTRAMUSCULAR | Status: DC | PRN
  Administered 2017-01-10: 2 mg via INTRAVENOUS

## 2017-01-10 MED ORDER — FENTANYL CITRATE (PF) 100 MCG/2ML IJ SOLN
INTRAMUSCULAR | Status: AC
  Filled 2017-01-10: qty 2

## 2017-01-10 MED ORDER — FENTANYL CITRATE (PF) 100 MCG/2ML IJ SOLN
INTRAMUSCULAR | Status: DC | PRN
  Administered 2017-01-10 (×2): 50 ug via INTRAVENOUS

## 2017-01-10 MED ORDER — MIDAZOLAM HCL 2 MG/2ML IJ SOLN
INTRAMUSCULAR | Status: AC
  Filled 2017-01-10: qty 2

## 2017-01-10 NOTE — Op Note (Signed)
[facility] Patient Name: Carl Ortega Procedure Date: 01/10/2017 11:42 AM MRN: [id number] Account #: [id number] Date of Birth: Jul 18, 1950 Admit Type: Outpatient Age: 66 Room: [facility] ENDO ROOM 1 Gender: Male Note Status: Finalized Procedure:            Colonoscopy Indications:          Screening for colorectal malignant neoplasm Providers:            Seeplaputhur G. Jamal Collin, MD Referring MD:         [name] (Referring MD) Medicines:            General Anesthesia Complications:        No immediate complications. Procedure:            Pre-Anesthesia Assessment:                       - General anesthesia under the supervision of an                        anesthesiologist was determined to be medically                        necessary for this procedure based on review of the                        patient's medical history, medications, and prior                        anesthesia history.                       After obtaining informed consent, the colonoscope was                        passed under direct vision. Throughout the procedure,                        the patient's blood pressure, pulse, and oxygen                        saturations were monitored continuously. The Olympus                        PCF-H180AL colonoscope ( S#: [id number] ) was introduced                        through the anus and advanced to the the cecum,                        identified by the ileocecal valve. The colonoscopy was                        performed without difficulty. The patient tolerated the                        procedure well. The quality of the bowel preparation                        was adequate to identify polyps 6 mm and larger in size. Findings:      The perianal and digital rectal examinations  were normal.      The entire examined colon appeared normal on direct and retroflexion       views. Impression:           - The entire examined  colon is normal on direct and                        retroflexion views.                       - No specimens collected. Recommendation:       - Discharge patient to home.                       - Resume previous diet.                       - Continue present medications.                       - Repeat colonoscopy in 10 years for screening purposes. Procedure Code(s):    --- Professional ---                       [phone], Colonoscopy, flexible; diagnostic, including                        collection of specimen(s) by brushing or washing, when                        performed (separate procedure) Diagnosis Code(s):    --- Professional ---                       Z12.11, Encounter for screening for malignant neoplasm                        of colon CPT copyright 2016 American Medical Association. All rights reserved. The codes documented in this report are preliminary and upon coder review may  be revised to meet current compliance requirements. Christene Lye, MD 01/10/2017 12:20:59 PM This report has been signed electronically. Number of Addenda: 0 Note Initiated On: 01/10/2017 11:42 AM Scope Withdrawal Time: 0 hours 6 minutes 5 seconds  Total Procedure Duration: 0 hours 19 minutes 31 seconds       [facility]

## 2017-01-10 NOTE — Anesthesia Preprocedure Evaluation (Signed)
Anesthesia Evaluation  Patient identified by MRN, date of birth, ID band Patient awake    Reviewed: Allergy & Precautions, NPO status , Patient's Chart, lab work & pertinent test results  Airway Mallampati: II       Dental  (+) Teeth Intact   Pulmonary COPD, Current Smoker,     + decreased breath sounds      Cardiovascular Exercise Tolerance: Good hypertension, Pt. on medications  Rhythm:Regular     Neuro/Psych Anxiety Depression Bipolar Disorder    GI/Hepatic negative GI ROS, (+)     substance abuse  alcohol use, cocaine use and marijuana use,   Endo/Other  negative endocrine ROS  Renal/GU negative Renal ROS     Musculoskeletal   Abdominal Normal abdominal exam  (+)   Peds negative pediatric ROS (+)  Hematology negative hematology ROS (+)   Anesthesia Other Findings   Reproductive/Obstetrics                             Anesthesia Physical Anesthesia Plan  ASA: III  Anesthesia Plan: General   Post-op Pain Management:    Induction: Intravenous  PONV Risk Score and Plan:   Airway Management Planned: Natural Airway and Nasal Cannula  Additional Equipment:   Intra-op Plan:   Post-operative Plan:   Informed Consent: I have reviewed the patients History and Physical, chart, labs and discussed the procedure including the risks, benefits and alternatives for the proposed anesthesia with the patient or authorized representative who has indicated his/her understanding and acceptance.     Plan Discussed with: Surgeon  Anesthesia Plan Comments:         Anesthesia Quick Evaluation

## 2017-01-10 NOTE — Anesthesia Postprocedure Evaluation (Signed)
Anesthesia Post Note  Patient: Carl Ortega  Procedure(s) Performed: COLONOSCOPY WITH PROPOFOL (N/A )  Patient location during evaluation: PACU Anesthesia Type: General Level of consciousness: awake Pain management: pain level controlled Vital Signs Assessment: post-procedure vital signs reviewed and stable Respiratory status: nonlabored ventilation Cardiovascular status: stable Anesthetic complications: no     Last Vitals:  Vitals:   01/10/17 1220 01/10/17 1230  BP: 110/73 115/76  Pulse: 69 76  Resp: 17 18  Temp: (!) 36.1 C   SpO2: 100% 97%    Last Pain:  Vitals:   01/10/17 1220  TempSrc: Tympanic                 [name],Eban Weick

## 2017-01-10 NOTE — Transfer of Care (Signed)
Immediate Anesthesia Transfer of Care Note  Patient: Carl Ortega  Procedure(s) Performed: COLONOSCOPY WITH PROPOFOL (N/A )  Patient Location: PACU  Anesthesia Type:General  Level of Consciousness: awake  Airway & Oxygen Therapy: Patient Spontanous Breathing and Patient connected to nasal cannula oxygen  Post-op Assessment: Report given to RN  Post vital signs: Reviewed  Last Vitals:  Vitals:   01/10/17 0941  BP: (!) 145/95  Pulse: 79  Resp: 14  Temp: 36.8 C  SpO2: 100%    Last Pain:  Vitals:   01/10/17 0941  TempSrc: Tympanic         Complications: No apparent anesthesia complications

## 2017-01-10 NOTE — H&P (View-Only) (Signed)
Patient ID: Carl Ortega, male   DOB: Dec 09, 1950, 66 y.o.   MRN: [id number]  Chief Complaint  Patient presents with  . Other    HPI Carl Ortega is a 66 y.o. male here today for a evaluation of a right inguinal hernia. Patient noticed this area about two months ago. No pain or change. Patient had a ultrasound done on 11/09/2016. He had a left inguinal hernia repair years ago. Current smoker and a history of marijuana and cocaine use. He states he has not used illicit drugs in "a long time". He receives his medical care mostly  through the [place] and does not have a PCP. He is retired and lives at home with his wife and does not routinely engage in strenuous exercise.   HPI  Past Medical History:  Diagnosis Date  . Bipolar 1 disorder ([place])   . Brain injury ([place])   . Hypertension   . PTSD (post-traumatic stress disorder)     Past Surgical History:  Procedure Laterality Date  . HERNIA REPAIR  2010   left groin  . none      Family History  Problem Relation Age of Onset  . Hypertension Unknown     Social History Social History  Substance Use Topics  . Smoking status: Current Every Day Smoker  . Smokeless tobacco: Never Used  . Alcohol use 2.4 oz/week    4 Standard drinks or equivalent per week    Allergies  Allergen Reactions  . Ketorolac Rash    Current Outpatient Prescriptions  Medication Sig Dispense Refill  . acetaminophen (TYLENOL) 325 MG tablet Take 650 mg by mouth 3 (three) times daily as needed for mild pain, moderate pain or fever.    Marland Kitchen amLODipine (NORVASC) 10 MG tablet Take 10 mg by mouth daily.    Marland Kitchen aspirin EC 81 MG tablet Take 81 mg by mouth daily.    . Calcium Carbonate-Vitamin D (CALCIUM 600+D) 600-400 MG-UNIT tablet Take 2 tablets by mouth daily.    . diclofenac (VOLTAREN) 50 MG EC tablet Take 100 mg by mouth 2 (two) times daily as needed for mild pain or moderate pain.    . hydroxypropyl methylcellulose / hypromellose (ISOPTO TEARS / GONIOVISC) 2.5 %  ophthalmic solution Place 1 drop into both eyes 2 (two) times daily.    . hydrOXYzine (ATARAX/VISTARIL) 25 MG tablet Take 25 mg by mouth every evening.    Marland Kitchen lisinopril-hydrochlorothiazide (PRINZIDE,ZESTORETIC) 20-25 MG tablet Take 2 tablets by mouth daily.    Marland Kitchen loratadine (CLARITIN) 10 MG tablet Take 10 mg by mouth daily as needed for allergies.    . Melatonin 3 MG TABS Take 6 mg by mouth every evening. Between 1800-2000    . Multiple Vitamins-Minerals (MULTIVITAMIN WITH MINERALS) tablet Take 1 tablet by mouth daily.    Marland Kitchen omeprazole (PRILOSEC) 20 MG capsule Take 20 mg by mouth daily.    . propranolol ER (INDERAL LA) 80 MG 24 hr capsule Take 160 mg by mouth daily.     . QUEtiapine (SEROQUEL) 100 MG tablet Take 100 mg by mouth at bedtime.    . sennosides-docusate sodium (SENOKOT-S) 8.6-50 MG tablet Take 2 tablets by mouth daily. To prevent constipation    . polyethylene glycol powder (GLYCOLAX/MIRALAX) powder 255 grams one bottle for colonoscopy prep 255 g 0   No current facility-administered medications for this visit.     Review of Systems Review of Systems  Constitutional: Negative.   Respiratory: Negative.   Cardiovascular: Negative.  Blood pressure 124/74, pulse 84, resp. rate 14, height 5\' 10"  (1.778 m), weight 152 lb (68.9 kg).  Physical Exam Physical Exam  Constitutional: He is oriented to person, place, and time. He appears well-developed and well-nourished.  Eyes: Conjunctivae are normal. No scleral icterus.  Neck: Neck supple.  Cardiovascular: Normal rate, regular rhythm and normal heart sounds.   Pulmonary/Chest: Effort normal and breath sounds normal.  Abdominal: Soft. Normal appearance and bowel sounds are normal. There is no hepatomegaly. There is no tenderness. A hernia is present. Hernia confirmed positive in the right inguinal area.    Lymphadenopathy:    He has no cervical adenopathy.  Neurological: He is alert and oriented to person, place, and time.  Skin:  Skin is warm and dry.    Data Reviewed Notes reviewed   Assessment     Right inguinal hernia - prominent medium sized and reducible on physical exam today. He had a left inguinal hernia repair several years ago. Given the hernia is symptomatic, will recommend for hernia repair and patient agreed. Given that patient has not had a colonoscopy or stool test in the last 10 years and does not know his family's colon cancer history, counseled patient on scheduling for a colonoscopy to screen for any polyps prior to hernia repair. He was also counseled on the importance of abstaining from any illicit drug use, its intraoperative risks, and acknowledges that he knows he will be tested the day of the operation.   Plan    Will schedule for colonoscopy prior to hernia repair to evaluate for anything else of concern before operation.    Colonoscopy with possible biopsy/polypectomy prn: Information regarding the procedure, including its potential risks and complications (including but not limited to perforation of the bowel, which may require emergency surgery to repair, and bleeding) was verbally given to the patient. Educational information regarding lower intestinal endoscopy was given to the patient. Written instructions for how to complete the bowel prep using Miralax were provided. The importance of drinking ample fluids to avoid dehydration as a result of the prep emphasized.  Hernia precautions and incarceration were discussed with the patient. If they develop symptoms of an incarcerated hernia, they were encouraged to seek prompt medical attention.  I have recommended repair of the hernia using mesh on an outpatient basis in the near future. The risk of infection was reviewed. The role of prosthetic mesh to minimize the risk of recurrence was reviewed.    HPI, Physical Exam, Assessment and Plan have been scribed under the direction and in the presence of Mckinley Jewel, MD  Gaspar Cola,  CMA   I have completed the exam and reviewed the above documentation for accuracy and completeness.  I agree with the above.  Haematologist has been used and any errors in dictation or transcription are unintentional.  Armen Waring G. Jamal Collin, M.D., F.A.C.S.   Junie Panning G 12/28/2016, 10:31 AM  Patient has been scheduled for a colonoscopy on 01-10-17 at [facility]. Miralax prescription has been sent in to the patient's pharmacy today. Colonoscopy instructions have been reviewed with the patient. This patient is aware to call the office if they have further questions. It is okay for patient to continue an 81 mg aspirin once daily.    Patient's hernia surgery has been scheduled for 01-16-17 at [facility]. It is okay for patient to continue an 81 mg aspirin once daily.     Dominga Ferry, CMA

## 2017-01-10 NOTE — Anesthesia Post-op Follow-up Note (Signed)
Anesthesia QCDR form completed.        

## 2017-01-10 NOTE — Interval H&P Note (Signed)
History and Physical Interval Note:  01/10/2017 10:03 AM  Carl Ortega  has presented today for surgery, with the diagnosis of screening  The various methods of treatment have been discussed with the patient and family. After consideration of risks, benefits and other options for treatment, the patient has consented to  Procedure(s): COLONOSCOPY WITH PROPOFOL (N/A) as a surgical intervention .  The patient's history has been reviewed, patient examined, no change in status, stable for surgery.  I have reviewed the patient's chart and labs.  Questions were answered to the patient's satisfaction.     SANKAR,SEEPLAPUTHUR G

## 2017-01-11 ENCOUNTER — Encounter: Payer: Self-pay | Admitting: General Surgery

## 2017-01-11 ENCOUNTER — Other Ambulatory Visit: Payer: Medicare Other

## 2017-01-12 ENCOUNTER — Encounter
Admission: RE | Admit: 2017-01-12 | Discharge: 2017-01-12 | Disposition: A | Discharge status: Home / Self Care | Payer: Medicare Other | Source: Ambulatory Visit | Attending: General Surgery | Admitting: General Surgery

## 2017-01-12 DIAGNOSIS — I10 Essential (primary) hypertension: Secondary | ICD-10-CM | POA: Insufficient documentation

## 2017-01-12 DIAGNOSIS — Z01812 Encounter for preprocedural laboratory examination: Secondary | ICD-10-CM | POA: Diagnosis not present

## 2017-01-12 DIAGNOSIS — Z01810 Encounter for preprocedural cardiovascular examination: Secondary | ICD-10-CM | POA: Diagnosis not present

## 2017-01-12 HISTORY — DX: Gastro-esophageal reflux disease without esophagitis: K21.9

## 2017-01-12 LAB — HEMOGLOBIN: HEMOGLOBIN: 15.3 g/dL (ref 13.0–18.0)

## 2017-01-12 NOTE — Patient Instructions (Addendum)
Your procedure is scheduled on: Tuesday 01/16/17 Report to [facility]. 2ND FLOOR MEDICAL MALL ENTRANCE. To find out your arrival time please call [phone] between 1PM - 3PM on Monday 01/15/17.  Remember: Instructions that are not followed completely may result in serious medical risk, up to and including death, or upon the discretion of your surgeon and anesthesiologist your surgery may need to be rescheduled.    __X__ 1. Do not eat anything after midnight the night before your    procedure.  No gum chewing or hard candies.  You may drink clear   liquids up to 2 hours before you are scheduled to arrive at the   hospital for your procedure. Do not drink clear liquids within 2   hours of scheduled arrival to the hospital as this may lead to your   procedure being delayed or rescheduled.       Clear liquids include:   Water or Apple juice without pulp   Clear carbohydrate beverage such as Clearfast or Gatorade   Black coffee or Clear Tea (no milk, no creamer, do not add anything   to the coffee or tea)    Diabetics should only drink water   __X__ 2. No Alcohol for 24 hours before or after surgery.   ____ 3. Bring all medications with you on the day of surgery if instructed.    __X__ 4. Notify your doctor if there is any change in your medical condition     (cold, fever, infections).             __X___5. No smoking within 24 hours of your surgery.     Do not wear jewelry, make-up, hairpins, clips or nail polish.  Do not wear lotions, powders, or perfumes.   Do not shave 48 hours prior to surgery. Men may shave face and neck.  Do not bring valuables to the hospital.    [facility] is not responsible for any belongings or valuables.               Contacts, dentures or bridgework may not be worn into surgery.  Leave your suitcase in the car. After surgery it may be brought to your room.  For patients admitted to the hospital, discharge time is determined by your                 treatment team.   Patients discharged the day of surgery will not be allowed to drive home.   Please read over the following fact sheets that you were given:   MRSA Information   __X__ Take these medicines the morning of surgery with A SIP OF WATER:    1. aMLODIPINE  2. oMEPRAZOLE  3. pROPRANOLOL  4.  5.  6.  ____ Fleet Enema (as directed)   __X__ Use CHG Soap/SAGE wipes as directed  ____ Use inhalers on the day of surgery  ____ Stop metformin 2 days prior to surgery    ____ Take 1/2 of usual insulin dose the night before surgery and none on the morning of surgery.   ____ Stop Coumadin/Plavix/aspirin on   __X__ Stop Anti-inflammatories such as Advil, Aleve, Ibuprofen, Motrin, Naproxen, Naprosyn, Goodies,powder, or aspirin products.  OK to take Tylenol.   __X__ Stop supplements, Vitamin E, Fish Oil until after surgery.    ____ Bring C-Pap to the hospital.

## 2017-01-16 ENCOUNTER — Encounter: Payer: Self-pay | Admitting: *Deleted

## 2017-01-16 ENCOUNTER — Ambulatory Visit: Payer: Medicare Other | Admitting: Anesthesiology

## 2017-01-16 ENCOUNTER — Ambulatory Visit
Admission: RE | Admit: 2017-01-16 | Discharge: 2017-01-16 | Disposition: A | Discharge status: Home / Self Care | Payer: Medicare Other | Source: Ambulatory Visit | Attending: General Surgery | Admitting: General Surgery

## 2017-01-16 ENCOUNTER — Encounter
Admission: RE | Disposition: A | Discharge status: Home / Self Care | Payer: Self-pay | Source: Ambulatory Visit | Attending: General Surgery

## 2017-01-16 DIAGNOSIS — J449 Chronic obstructive pulmonary disease, unspecified: Secondary | ICD-10-CM | POA: Insufficient documentation

## 2017-01-16 DIAGNOSIS — F17200 Nicotine dependence, unspecified, uncomplicated: Secondary | ICD-10-CM | POA: Diagnosis not present

## 2017-01-16 DIAGNOSIS — F319 Bipolar disorder, unspecified: Secondary | ICD-10-CM | POA: Insufficient documentation

## 2017-01-16 DIAGNOSIS — I10 Essential (primary) hypertension: Secondary | ICD-10-CM | POA: Diagnosis not present

## 2017-01-16 DIAGNOSIS — K4090 Unilateral inguinal hernia, without obstruction or gangrene, not specified as recurrent: Secondary | ICD-10-CM | POA: Diagnosis not present

## 2017-01-16 DIAGNOSIS — F418 Other specified anxiety disorders: Secondary | ICD-10-CM | POA: Diagnosis not present

## 2017-01-16 DIAGNOSIS — K4030 Unilateral inguinal hernia, with obstruction, without gangrene, not specified as recurrent: Secondary | ICD-10-CM | POA: Diagnosis not present

## 2017-01-16 DIAGNOSIS — Z7982 Long term (current) use of aspirin: Secondary | ICD-10-CM | POA: Insufficient documentation

## 2017-01-16 DIAGNOSIS — K219 Gastro-esophageal reflux disease without esophagitis: Secondary | ICD-10-CM | POA: Insufficient documentation

## 2017-01-16 DIAGNOSIS — Z79899 Other long term (current) drug therapy: Secondary | ICD-10-CM | POA: Diagnosis not present

## 2017-01-16 DIAGNOSIS — F4310 Post-traumatic stress disorder, unspecified: Secondary | ICD-10-CM | POA: Insufficient documentation

## 2017-01-16 HISTORY — PX: INGUINAL HERNIA REPAIR: SHX194

## 2017-01-16 LAB — URINE DRUG SCREEN, QUALITATIVE (ARMC ONLY)
Amphetamines, Ur Screen: NOT DETECTED
Barbiturates, Ur Screen: NOT DETECTED
Benzodiazepine, Ur Scrn: NOT DETECTED
Cannabinoid 50 Ng, Ur ~~LOC~~: POSITIVE — AB
Cocaine Metabolite,Ur ~~LOC~~: NOT DETECTED
MDMA (Ecstasy)Ur Screen: NOT DETECTED
Methadone Scn, Ur: NOT DETECTED
Opiate, Ur Screen: NOT DETECTED
PHENCYCLIDINE (PCP) UR S: NOT DETECTED
TRICYCLIC, UR SCREEN: NOT DETECTED

## 2017-01-16 SURGERY — REPAIR, HERNIA, INGUINAL, ADULT
Anesthesia: General | Site: Abdomen | Laterality: Right | Wound class: Clean

## 2017-01-16 MED ORDER — FENTANYL CITRATE (PF) 100 MCG/2ML IJ SOLN
INTRAMUSCULAR | Status: AC
  Administered 2017-01-16: 25 ug via INTRAVENOUS
  Filled 2017-01-16: qty 2

## 2017-01-16 MED ORDER — ONDANSETRON HCL 4 MG/2ML IJ SOLN
INTRAMUSCULAR | Status: AC
  Filled 2017-01-16: qty 2

## 2017-01-16 MED ORDER — FAMOTIDINE 20 MG PO TABS
ORAL_TABLET | ORAL | Status: AC
  Filled 2017-01-16: qty 1

## 2017-01-16 MED ORDER — ONDANSETRON HCL 4 MG/2ML IJ SOLN
INTRAMUSCULAR | Status: DC | PRN
  Administered 2017-01-16: 4 mg via INTRAVENOUS

## 2017-01-16 MED ORDER — ACETAMINOPHEN 10 MG/ML IV SOLN
INTRAVENOUS | Status: DC | PRN
  Administered 2017-01-16: 1000 mg via INTRAVENOUS

## 2017-01-16 MED ORDER — BUPIVACAINE HCL (PF) 0.5 % IJ SOLN
INTRAMUSCULAR | Status: AC
  Filled 2017-01-16: qty 30

## 2017-01-16 MED ORDER — OXYCODONE-ACETAMINOPHEN 5-325 MG PO TABS: 1.0000 | ORAL_TABLET | ORAL | 0 refills | Status: DC | PRN

## 2017-01-16 MED ORDER — HYDROMORPHONE HCL 1 MG/ML IJ SOLN
INTRAMUSCULAR | Status: DC | PRN
  Administered 2017-01-16: 0.5 mg via INTRAVENOUS

## 2017-01-16 MED ORDER — VASOPRESSIN 20 UNIT/ML IV SOLN
INTRAVENOUS | Status: DC | PRN
  Administered 2017-01-16: 1 [IU] via INTRAVENOUS
  Administered 2017-01-16: 2 [IU] via INTRAVENOUS

## 2017-01-16 MED ORDER — EPHEDRINE SULFATE 50 MG/ML IJ SOLN
INTRAMUSCULAR | Status: DC | PRN
  Administered 2017-01-16 (×2): 10 mg via INTRAVENOUS

## 2017-01-16 MED ORDER — SEVOFLURANE IN SOLN
RESPIRATORY_TRACT | Status: AC
  Filled 2017-01-16: qty 250

## 2017-01-16 MED ORDER — FENTANYL CITRATE (PF) 100 MCG/2ML IJ SOLN
25.0000 ug | INTRAMUSCULAR | Status: DC | PRN
  Administered 2017-01-16 (×4): 25 ug via INTRAVENOUS

## 2017-01-16 MED ORDER — FENTANYL CITRATE (PF) 250 MCG/5ML IJ SOLN
INTRAMUSCULAR | Status: AC
  Filled 2017-01-16: qty 5

## 2017-01-16 MED ORDER — CEFAZOLIN SODIUM-DEXTROSE 2-3 GM-%(50ML) IV SOLR
INTRAVENOUS | Status: DC | PRN
  Administered 2017-01-16: 2 g via INTRAVENOUS

## 2017-01-16 MED ORDER — HYDROMORPHONE HCL 1 MG/ML IJ SOLN
INTRAMUSCULAR | Status: AC
  Filled 2017-01-16: qty 1

## 2017-01-16 MED ORDER — PHENYLEPHRINE HCL 10 MG/ML IJ SOLN
INTRAMUSCULAR | Status: DC | PRN
  Administered 2017-01-16 (×2): 100 ug via INTRAVENOUS

## 2017-01-16 MED ORDER — FENTANYL CITRATE (PF) 100 MCG/2ML IJ SOLN
INTRAMUSCULAR | Status: AC
  Filled 2017-01-16: qty 2

## 2017-01-16 MED ORDER — LACTATED RINGERS IV SOLN
INTRAVENOUS | Status: DC
  Administered 2017-01-16 (×2): via INTRAVENOUS

## 2017-01-16 MED ORDER — ONDANSETRON HCL 4 MG/2ML IJ SOLN: 4.0000 mg | Freq: Once | INTRAMUSCULAR | Status: DC | PRN

## 2017-01-16 MED ORDER — LIDOCAINE HCL (CARDIAC) 20 MG/ML IV SOLN
INTRAVENOUS | Status: DC | PRN
  Administered 2017-01-16: 50 mg via INTRAVENOUS

## 2017-01-16 MED ORDER — MIDAZOLAM HCL 2 MG/2ML IJ SOLN
INTRAMUSCULAR | Status: AC
  Filled 2017-01-16: qty 2

## 2017-01-16 MED ORDER — LIDOCAINE HCL (PF) 1 % IJ SOLN
INTRAMUSCULAR | Status: AC
  Filled 2017-01-16: qty 30

## 2017-01-16 MED ORDER — LIDOCAINE HCL 1 % IJ SOLN
INTRAMUSCULAR | Status: DC | PRN
  Administered 2017-01-16: 40 mL

## 2017-01-16 MED ORDER — PROPOFOL 10 MG/ML IV BOLUS
INTRAVENOUS | Status: AC
  Filled 2017-01-16: qty 20

## 2017-01-16 MED ORDER — MIDAZOLAM HCL 2 MG/2ML IJ SOLN
INTRAMUSCULAR | Status: DC | PRN
  Administered 2017-01-16: 2 mg via INTRAVENOUS

## 2017-01-16 MED ORDER — CHLORHEXIDINE GLUCONATE CLOTH 2 % EX PADS: 6.0000 | MEDICATED_PAD | Freq: Once | CUTANEOUS | Status: DC

## 2017-01-16 MED ORDER — ACETAMINOPHEN 10 MG/ML IV SOLN
INTRAVENOUS | Status: AC
  Filled 2017-01-16: qty 100

## 2017-01-16 MED ORDER — PROPOFOL 10 MG/ML IV BOLUS
INTRAVENOUS | Status: DC | PRN
  Administered 2017-01-16: 150 mg via INTRAVENOUS

## 2017-01-16 MED ORDER — DEXAMETHASONE SODIUM PHOSPHATE 10 MG/ML IJ SOLN
INTRAMUSCULAR | Status: DC | PRN
  Administered 2017-01-16: 10 mg via INTRAVENOUS

## 2017-01-16 MED ORDER — FENTANYL CITRATE (PF) 100 MCG/2ML IJ SOLN
INTRAMUSCULAR | Status: DC | PRN
  Administered 2017-01-16 (×2): 50 ug via INTRAVENOUS

## 2017-01-16 SURGICAL SUPPLY — 31 items
ADH SKN CLS APL DERMABOND .7 (GAUZE/BANDAGES/DRESSINGS) ×1
BLADE SURG 15 STRL SS SAFETY (BLADE) ×3 IMPLANT
CANISTER SUCT 1200ML W/VALVE (MISCELLANEOUS) ×3 IMPLANT
CHLORAPREP W/TINT 26ML (MISCELLANEOUS) ×3 IMPLANT
DECANTER SPIKE VIAL GLASS SM (MISCELLANEOUS) ×6 IMPLANT
DERMABOND ADVANCED (GAUZE/BANDAGES/DRESSINGS) ×2
DERMABOND ADVANCED .7 DNX12 (GAUZE/BANDAGES/DRESSINGS) ×1 IMPLANT
DRAIN PENROSE 1/4X12 LTX (DRAIN) ×3 IMPLANT
DRAPE LAPAROTOMY 100X77 ABD (DRAPES) ×3 IMPLANT
ELECT REM PT RETURN 9FT ADLT (ELECTROSURGICAL) ×3
ELECTRODE REM PT RTRN 9FT ADLT (ELECTROSURGICAL) ×1 IMPLANT
GLOVE BIO SURGEON STRL SZ7 (GLOVE) ×3 IMPLANT
GOWN STRL REUS W/ TWL LRG LVL3 (GOWN DISPOSABLE) ×2 IMPLANT
GOWN STRL REUS W/TWL LRG LVL3 (GOWN DISPOSABLE) ×6
KIT RM TURNOVER STRD PROC AR (KITS) ×3 IMPLANT
LABEL OR SOLS (LABEL) ×3 IMPLANT
MESH PARIETEX PROGRIP RIGHT (Mesh General) ×3 IMPLANT
NDL HYPO 25X1 1.5 SAFETY (NEEDLE) ×1 IMPLANT
NEEDLE HYPO 22GX1.5 SAFETY (NEEDLE) ×3 IMPLANT
NEEDLE HYPO 25X1 1.5 SAFETY (NEEDLE) ×3 IMPLANT
NS IRRIG 500ML POUR BTL (IV SOLUTION) ×3 IMPLANT
PACK BASIN MINOR ARMC (MISCELLANEOUS) ×3 IMPLANT
SUT PDS 2-0 27IN (SUTURE) ×6 IMPLANT
SUT VIC AB 2-0 SH 27 (SUTURE) ×9
SUT VIC AB 2-0 SH 27XBRD (SUTURE) ×1 IMPLANT
SUT VIC AB 3-0 54X BRD REEL (SUTURE) ×1 IMPLANT
SUT VIC AB 3-0 BRD 54 (SUTURE) ×3
SUT VIC AB 3-0 SH 27 (SUTURE) ×3
SUT VIC AB 3-0 SH 27X BRD (SUTURE) ×1 IMPLANT
SUT VIC AB 4-0 FS2 27 (SUTURE) ×3 IMPLANT
SYR CONTROL 10ML (SYRINGE) ×3 IMPLANT

## 2017-01-16 NOTE — Anesthesia Postprocedure Evaluation (Signed)
Anesthesia Post Note  Patient: Carl Ortega  Procedure(s) Performed: HERNIA REPAIR INGUINAL ADULT WITH PARTIAL OMENTECTOMY (Right Abdomen)  Patient location during evaluation: PACU Anesthesia Type: General Level of consciousness: awake Pain management: pain level controlled Vital Signs Assessment: post-procedure vital signs reviewed and stable Respiratory status: spontaneous breathing Cardiovascular status: stable Anesthetic complications: no     Last Vitals:  Vitals:   01/16/17 0940 01/16/17 0955  BP: 114/66 98/64  Pulse: 62 62  Resp: 12 12  Temp:    SpO2: 97% 96%    Last Pain:  Vitals:   01/16/17 1000  TempSrc:   PainSc: 2                  [name],Carl Ortega

## 2017-01-16 NOTE — Transfer of Care (Signed)
Immediate Anesthesia Transfer of Care Note  Patient: Carl Ortega  Procedure(s) Performed: HERNIA REPAIR INGUINAL ADULT WITH PARTIAL OMENTECTOMY (Right Abdomen)  Patient Location: PACU  Anesthesia Type:General  Level of Consciousness: sedated  Airway & Oxygen Therapy: Patient Spontanous Breathing and Patient connected to face mask oxygen  Post-op Assessment: Report given to RN and Post -op Vital signs reviewed and stable  Post vital signs: Reviewed and stable  Last Vitals:  Vitals:   01/16/17 0651 01/16/17 0910  BP: 124/86 103/65  Pulse: 74 62  Resp: 16 (!) 9  Temp: (!) 36.1 C 36.5 C  SpO2: 100% 95%    Last Pain:  Vitals:   01/16/17 0910  TempSrc:   PainSc: Asleep         Complications: No apparent anesthesia complications

## 2017-01-16 NOTE — Interval H&P Note (Signed)
History and Physical Interval Note:  01/16/2017 7:07 AM  Carl Ortega  has presented today for surgery, with the diagnosis of RIGHT INGUINAL HERNIA  The various methods of treatment have been discussed with the patient and family. After consideration of risks, benefits and other options for treatment, the patient has consented to  Procedure(s): HERNIA REPAIR INGUINAL ADULT (Right) as a surgical intervention .  The patient's history has been reviewed, patient examined, no change in status, stable for surgery.  I have reviewed the patient's chart and labs.  Questions were answered to the patient's satisfaction.     SANKAR,SEEPLAPUTHUR G  Colonoscopy completed last week- normal

## 2017-01-16 NOTE — Anesthesia Preprocedure Evaluation (Signed)
Anesthesia Evaluation  Patient identified by MRN, date of birth, ID band Patient awake    Reviewed: Allergy & Precautions, NPO status , Patient's Chart, lab work & pertinent test results  Airway Mallampati: II       Dental  (+) Teeth Intact   Pulmonary COPD, Current Smoker,     + decreased breath sounds      Cardiovascular Exercise Tolerance: Good hypertension, Pt. on medications  Rhythm:Regular     Neuro/Psych Anxiety Depression Bipolar Disorder    GI/Hepatic Neg liver ROS, GERD  Medicated,(+)     substance abuse  ,   Endo/Other  negative endocrine ROS  Renal/GU negative Renal ROS     Musculoskeletal   Abdominal Normal abdominal exam  (+)   Peds negative pediatric ROS (+)  Hematology   Anesthesia Other Findings   Reproductive/Obstetrics                             Anesthesia Physical Anesthesia Plan  ASA: III  Anesthesia Plan: General   Post-op Pain Management:    Induction: Intravenous  PONV Risk Score and Plan:   Airway Management Planned: LMA  Additional Equipment:   Intra-op Plan:   Post-operative Plan: Extubation in OR  Informed Consent:   Plan Discussed with: CRNA  Anesthesia Plan Comments:         Anesthesia Quick Evaluation

## 2017-01-16 NOTE — Discharge Instructions (Signed)

## 2017-01-16 NOTE — Anesthesia Post-op Follow-up Note (Signed)
Anesthesia QCDR form completed.        

## 2017-01-16 NOTE — Anesthesia Procedure Notes (Signed)
Procedure Name: LMA Insertion Date/Time: 01/16/2017 7:35 AM Performed by: [facility] Pre-anesthesia Checklist: Patient identified, Patient being monitored, Timeout performed, Emergency Drugs available and Suction available Patient Re-evaluated:Patient Re-evaluated prior to induction Oxygen Delivery Method: Circle system utilized Preoxygenation: Pre-oxygenation with 100% oxygen Induction Type: IV induction Ventilation: Mask ventilation without difficulty LMA: LMA inserted LMA Size: 3.5 Tube type: Oral Number of attempts: 1 Placement Confirmation: positive ETCO2 and breath sounds checked- equal and bilateral Tube secured with: Tape Dental Injury: Teeth and Oropharynx as per pre-operative assessment

## 2017-01-16 NOTE — Op Note (Signed)
Preop diagnosis: Right inguinal hernia  Post op diagnosis: Right inguinal hernia with incarcerated redundant omentum  Operation: Repair right inguinal hernia partial omentectomy  Surgeon: Teressa Senter  Assistant:     Anesthesia: Gen.  Complications: None  EBL: 5 mL  Drains: None  Description: Patient was placed supine on the operating table and put to sleep with an LMA. The right inguinal region was prepped and draped as sterile field and timeout performed. Local anesthetic containing 0.5% Marcaine mixed with 1% Xylocaine was instilled around the inguinal canal to obtain an adequate block. Skin incision was made along the medial two thirds. And was deepened through the layers down the external oblique. Bleeding was controlled cautery and ligatures of 3-0 Vicryl. The external oblique was exposed and opened along the line of its fibers. Patient had a large hernia extending well beyond the external ring area. The fascia overlying the cord was incised after the cord was freed from the posterior wall. The hernia was identified and noted be firmly adherent to the cord structures especially the Vas. In order to facilitate further dissection the sac was opened to reveal that patient had a large redundant portion of omentum which was difficult reduced back in the abdominal cavity. In view of this the redundant portion of the omentum was removed by serially taken down with the use of LigaSure device. Following this the remaining portion of the omentum was easily pushed back through a narrow internal ring. This facilitated further dissection of the sac from the cord structures all the way down to the internal ring area. Under direct vision the neck of the sac was suture ligated with 2-0 Vicryl and amputated. The suture of the stump was used to tack it to the undersurface of the internal oblique muscle. The fascia over the cord was then reapproximated with 2-0 Vicryl. Posterior wall was somewhat lax with the  internal oblique muscle overlapping towards the inguinal ligament over the transversalis fascia. This was approximated with a loose running stitch of 2-0 Vicryl. Progrip mesh was then brought up and placed around the cord and laid down against the posterior wall lateral ends were tucked underneath the external oblique. Medial and was tacked to the pubic tubercle with that 3 stitches of 2-0 PDS. Reinforced repair was felt to be adequate. Wound was irrigated. External oblique closed with running 2-0 PDS. Scarpa's fascia closed with running 3-0 Vicryl. Skin closed with subcuticular 4-0 Vicryl and Dermabond was applied

## 2017-01-16 NOTE — H&P (View-Only) (Signed)
Patient ID: Carl Ortega, male   DOB: 21-Jul-1950, 66 y.o.   MRN: [id number]  Chief Complaint  Patient presents with  . Other    HPI Carl Ortega is a 66 y.o. male here today for a evaluation of a right inguinal hernia. Patient noticed this area about two months ago. No pain or change. Patient had a ultrasound done on 11/09/2016. He had a left inguinal hernia repair years ago. Current smoker and a history of marijuana and cocaine use. He states he has not used illicit drugs in "a long time". He receives his medical care mostly  through the [place] and does not have a PCP. He is retired and lives at home with his wife and does not routinely engage in strenuous exercise.   HPI  Past Medical History:  Diagnosis Date  . Bipolar 1 disorder ([place])   . Brain injury ([place])   . Hypertension   . PTSD (post-traumatic stress disorder)     Past Surgical History:  Procedure Laterality Date  . HERNIA REPAIR  2010   left groin  . none      Family History  Problem Relation Age of Onset  . Hypertension Unknown     Social History Social History  Substance Use Topics  . Smoking status: Current Every Day Smoker  . Smokeless tobacco: Never Used  . Alcohol use 2.4 oz/week    4 Standard drinks or equivalent per week    Allergies  Allergen Reactions  . Ketorolac Rash    Current Outpatient Prescriptions  Medication Sig Dispense Refill  . acetaminophen (TYLENOL) 325 MG tablet Take 650 mg by mouth 3 (three) times daily as needed for mild pain, moderate pain or fever.    Marland Kitchen amLODipine (NORVASC) 10 MG tablet Take 10 mg by mouth daily.    Marland Kitchen aspirin EC 81 MG tablet Take 81 mg by mouth daily.    . Calcium Carbonate-Vitamin D (CALCIUM 600+D) 600-400 MG-UNIT tablet Take 2 tablets by mouth daily.    . diclofenac (VOLTAREN) 50 MG EC tablet Take 100 mg by mouth 2 (two) times daily as needed for mild pain or moderate pain.    . hydroxypropyl methylcellulose / hypromellose (ISOPTO TEARS / GONIOVISC) 2.5 %  ophthalmic solution Place 1 drop into both eyes 2 (two) times daily.    . hydrOXYzine (ATARAX/VISTARIL) 25 MG tablet Take 25 mg by mouth every evening.    Marland Kitchen lisinopril-hydrochlorothiazide (PRINZIDE,ZESTORETIC) 20-25 MG tablet Take 2 tablets by mouth daily.    Marland Kitchen loratadine (CLARITIN) 10 MG tablet Take 10 mg by mouth daily as needed for allergies.    . Melatonin 3 MG TABS Take 6 mg by mouth every evening. Between 1800-2000    . Multiple Vitamins-Minerals (MULTIVITAMIN WITH MINERALS) tablet Take 1 tablet by mouth daily.    Marland Kitchen omeprazole (PRILOSEC) 20 MG capsule Take 20 mg by mouth daily.    . propranolol ER (INDERAL LA) 80 MG 24 hr capsule Take 160 mg by mouth daily.     . QUEtiapine (SEROQUEL) 100 MG tablet Take 100 mg by mouth at bedtime.    . sennosides-docusate sodium (SENOKOT-S) 8.6-50 MG tablet Take 2 tablets by mouth daily. To prevent constipation    . polyethylene glycol powder (GLYCOLAX/MIRALAX) powder 255 grams one bottle for colonoscopy prep 255 g 0   No current facility-administered medications for this visit.     Review of Systems Review of Systems  Constitutional: Negative.   Respiratory: Negative.   Cardiovascular: Negative.  Blood pressure 124/74, pulse 84, resp. rate 14, height 5\' 10"  (1.778 m), weight 152 lb (68.9 kg).  Physical Exam Physical Exam  Constitutional: He is oriented to person, place, and time. He appears well-developed and well-nourished.  Eyes: Conjunctivae are normal. No scleral icterus.  Neck: Neck supple.  Cardiovascular: Normal rate, regular rhythm and normal heart sounds.   Pulmonary/Chest: Effort normal and breath sounds normal.  Abdominal: Soft. Normal appearance and bowel sounds are normal. There is no hepatomegaly. There is no tenderness. A hernia is present. Hernia confirmed positive in the right inguinal area.    Lymphadenopathy:    He has no cervical adenopathy.  Neurological: He is alert and oriented to person, place, and time.  Skin:  Skin is warm and dry.    Data Reviewed Notes reviewed   Assessment     Right inguinal hernia - prominent medium sized and reducible on physical exam today. He had a left inguinal hernia repair several years ago. Given the hernia is symptomatic, will recommend for hernia repair and patient agreed. Given that patient has not had a colonoscopy or stool test in the last 10 years and does not know his family's colon cancer history, counseled patient on scheduling for a colonoscopy to screen for any polyps prior to hernia repair. He was also counseled on the importance of abstaining from any illicit drug use, its intraoperative risks, and acknowledges that he knows he will be tested the day of the operation.   Plan    Will schedule for colonoscopy prior to hernia repair to evaluate for anything else of concern before operation.    Colonoscopy with possible biopsy/polypectomy prn: Information regarding the procedure, including its potential risks and complications (including but not limited to perforation of the bowel, which may require emergency surgery to repair, and bleeding) was verbally given to the patient. Educational information regarding lower intestinal endoscopy was given to the patient. Written instructions for how to complete the bowel prep using Miralax were provided. The importance of drinking ample fluids to avoid dehydration as a result of the prep emphasized.  Hernia precautions and incarceration were discussed with the patient. If they develop symptoms of an incarcerated hernia, they were encouraged to seek prompt medical attention.  I have recommended repair of the hernia using mesh on an outpatient basis in the near future. The risk of infection was reviewed. The role of prosthetic mesh to minimize the risk of recurrence was reviewed.    HPI, Physical Exam, Assessment and Plan have been scribed under the direction and in the presence of Mckinley Jewel, MD  Gaspar Cola,  CMA   I have completed the exam and reviewed the above documentation for accuracy and completeness.  I agree with the above.  Haematologist has been used and any errors in dictation or transcription are unintentional.  Seeplaputhur G. Jamal Collin, M.D., F.A.C.S.   Junie Panning G 12/28/2016, 10:31 AM  Patient has been scheduled for a colonoscopy on 01-10-17 at [facility]. Miralax prescription has been sent in to the patient's pharmacy today. Colonoscopy instructions have been reviewed with the patient. This patient is aware to call the office if they have further questions. It is okay for patient to continue an 81 mg aspirin once daily.    Patient's hernia surgery has been scheduled for 01-16-17 at [facility]. It is okay for patient to continue an 81 mg aspirin once daily.     Dominga Ferry, CMA

## 2017-01-17 LAB — SURGICAL PATHOLOGY

## 2017-01-23 ENCOUNTER — Encounter: Payer: Self-pay | Admitting: General Surgery

## 2017-01-23 ENCOUNTER — Ambulatory Visit (INDEPENDENT_AMBULATORY_CARE_PROVIDER_SITE_OTHER): Payer: Medicare Other | Admitting: General Surgery

## 2017-01-23 VITALS — BP 116/80 | HR 72 | Resp 12 | Ht 69.0 in | Wt 149.0 lb

## 2017-01-23 DIAGNOSIS — K4090 Unilateral inguinal hernia, without obstruction or gangrene, not specified as recurrent: Secondary | ICD-10-CM

## 2017-01-23 NOTE — Patient Instructions (Signed)
The patient is aware to call back for any questions or concerns.  

## 2017-01-23 NOTE — Progress Notes (Signed)
Patient ID: Carl Ortega, male   DOB: 1950-09-19, 66 y.o.   MRN: [id number]  Chief Complaint  Patient presents with  . Routine Post Op    HPI Carl Ortega is a 66 y.o. male.  Here today for postoperative visit, right inguinal hernia repair( Progrip mesh) and partial omentectomy 01-16-17, he states he is doing well, still sore. Denies any gastrointestinal issues, bowels are moving regular.  HPI  Past Medical History:  Diagnosis Date  . Bipolar 1 disorder ([place])   . Brain injury ([place])   . GERD (gastroesophageal reflux disease)   . Hypertension   . PTSD (post-traumatic stress disorder)   . PTSD (post-traumatic stress disorder)     Past Surgical History:  Procedure Laterality Date  . COLONOSCOPY WITH PROPOFOL N/A 01/10/2017   Procedure: COLONOSCOPY WITH PROPOFOL;  Surgeon: Christene Lye, MD;  Location: [facility];  Service: Endoscopy;  Laterality: N/A;  . HERNIA REPAIR  2010   left groin  . INGUINAL HERNIA REPAIR Right 01/16/2017   Procedure: HERNIA REPAIR INGUINAL ADULT WITH PARTIAL OMENTECTOMY;  Surgeon: Christene Lye, MD;  Location: [facility] ORS;  Service: General;  Laterality: Right;  . none      Family History  Problem Relation Age of Onset  . Hypertension Unknown     Social History Social History  Substance Use Topics  . Smoking status: Current Every Day Smoker    Packs/day: 0.25    Types: Cigarettes  . Smokeless tobacco: Former Systems developer  . Alcohol use 2.4 oz/week    4 Standard drinks or equivalent per week     Comment: denies regular use    Allergies  Allergen Reactions  . Ketorolac Rash    Current Outpatient Prescriptions  Medication Sig Dispense Refill  . acetaminophen (TYLENOL) 325 MG tablet Take 650 mg by mouth 3 (three) times daily as needed for mild pain, moderate pain or fever.    Marland Kitchen amLODipine (NORVASC) 10 MG tablet Take 10 mg by mouth daily.    Marland Kitchen aspirin EC 81 MG tablet Take 81 mg by mouth daily.    . Calcium Carbonate-Vitamin D  (CALCIUM 600+D) 600-400 MG-UNIT tablet Take 2 tablets by mouth daily.    . diclofenac (VOLTAREN) 50 MG EC tablet Take 100 mg by mouth 2 (two) times daily as needed for mild pain or moderate pain.    . hydroxypropyl methylcellulose / hypromellose (ISOPTO TEARS / GONIOVISC) 2.5 % ophthalmic solution Place 1 drop into both eyes 2 (two) times daily.    . hydrOXYzine (ATARAX/VISTARIL) 25 MG tablet Take 25 mg by mouth every evening.    Marland Kitchen lisinopril-hydrochlorothiazide (PRINZIDE,ZESTORETIC) 20-25 MG tablet Take 2 tablets by mouth daily.    Marland Kitchen loratadine (CLARITIN) 10 MG tablet Take 10 mg by mouth daily as needed for allergies.    . Melatonin 3 MG TABS Take 6 mg by mouth every evening. Between 1800-2000    . Multiple Vitamins-Minerals (MULTIVITAMIN WITH MINERALS) tablet Take 1 tablet by mouth daily.    Marland Kitchen omeprazole (PRILOSEC) 20 MG capsule Take 20 mg by mouth daily.    Marland Kitchen oxyCODONE-acetaminophen (ROXICET) 5-325 MG tablet Take 1 tablet by mouth every 4 (four) hours as needed. 20 tablet 0  . propranolol ER (INDERAL LA) 80 MG 24 hr capsule Take 160 mg by mouth daily.     . QUEtiapine (SEROQUEL) 100 MG tablet Take 100 mg by mouth at bedtime.    . sennosides-docusate sodium (SENOKOT-S) 8.6-50 MG tablet Take 2 tablets by mouth daily. To  prevent constipation     No current facility-administered medications for this visit.     Review of Systems Review of Systems  Constitutional: Negative.   Respiratory: Negative.   Cardiovascular: Negative.     Blood pressure 116/80, pulse 72, resp. rate 12, height 5\' 9"  (1.753 m), weight 149 lb (67.6 kg).  Physical Exam Physical Exam  Constitutional: He is oriented to person, place, and time. He appears well-developed and well-nourished.  Eyes: Conjunctivae are normal. No scleral icterus.  Abdominal: Soft. He exhibits no distension. Hernia confirmed negative in the right inguinal area.    Neurological: He is alert and oriented to person, place, and time.  Skin: Skin  is warm and dry.  Psychiatric: He has a normal mood and affect. His behavior is normal.    Data Reviewed Progress notes, op note, surgical pathology.  Surgical pathology: mature adipose tissue consistent with omentum.   Assessment    Stable postoperative course. Healing well.     Plan   Follow up in 1 month. After another 1-2 weeks he can gradually increase activity back to normal     HPI, Physical Exam, Assessment and Plan have been scribed under the direction and in the presence of Mckinley Jewel, MD Karie Fetch, RN I have completed the exam and reviewed the above documentation for accuracy and completeness.  I agree with the above.  Haematologist has been used and any errors in dictation or transcription are unintentional.  Yuchen Fedor G. Jamal Collin, M.D., F.A.C.S.   Junie Panning G 01/24/2017, 11:15 AM

## 2017-02-03 DIAGNOSIS — Z23 Encounter for immunization: Secondary | ICD-10-CM | POA: Diagnosis not present

## 2017-02-22 ENCOUNTER — Encounter: Payer: Self-pay | Admitting: General Surgery

## 2017-02-22 ENCOUNTER — Ambulatory Visit (INDEPENDENT_AMBULATORY_CARE_PROVIDER_SITE_OTHER): Payer: Medicare Other | Admitting: General Surgery

## 2017-02-22 VITALS — BP 128/74 | HR 78 | Resp 14 | Ht 69.0 in | Wt 153.0 lb

## 2017-02-22 DIAGNOSIS — K4090 Unilateral inguinal hernia, without obstruction or gangrene, not specified as recurrent: Secondary | ICD-10-CM

## 2017-02-22 NOTE — Patient Instructions (Addendum)
The patient is aware to call back for any questions or concerns. May resume physical activity without restrictions. Follow up as needed.

## 2017-02-22 NOTE — Progress Notes (Signed)
Patient ID: Carl Ortega, male   DOB: [birth year], 66 y.o.   MRN: [id number]  Chief Complaint  Patient presents with  . Routine Post Op    HPI Carl Ortega is a 66 y.o. male.  Here today for postoperative visit, right inguinal hernia on 01-16-17, he states he is doing well. Denies any gastrointestinal issues, bowels are moving regular.  HPI  Past Medical History:  Diagnosis Date  . Bipolar 1 disorder ([place])   . Brain injury ([place])   . GERD (gastroesophageal reflux disease)   . Hypertension   . PTSD (post-traumatic stress disorder)   . PTSD (post-traumatic stress disorder)     Past Surgical History:  Procedure Laterality Date  . COLONOSCOPY WITH PROPOFOL N/A 01/10/2017   Procedure: COLONOSCOPY WITH PROPOFOL;  Surgeon: Christene Lye, MD;  Location: [facility];  Service: Endoscopy;  Laterality: N/A;  . HERNIA REPAIR  2010   left groin  . INGUINAL HERNIA REPAIR Right 01/16/2017   Procedure: HERNIA REPAIR INGUINAL ADULT WITH PARTIAL OMENTECTOMY;  Surgeon: Christene Lye, MD;  Location: [facility] OR[facility];  Service: General;  Laterality: Right;  . none      Family History  Problem Relation Age of Onset  . Hypertension Unknown     Social History Social History   Tobacco Use  . Smoking status: Current Every Day Smoker    Packs/day: 0.25    Types: Cigarettes  . Smokeless tobacco: Former Network engineer Use Topics  . Alcohol use: Yes    Alcohol/week: 2.4 oz    Types: 4 Standard drinks or equivalent per week    Comment: denies regular use  . Drug use: Yes    Types: Cocaine    Comment: patient states 7-8 years since last use    Allergies  Allergen Reactions  . Ketorolac Rash    Current Outpatient Medications  Medication Sig Dispense Refill  . acetaminophen (TYLENOL) 325 MG tablet Take 650 mg by mouth 3 (three) times daily as needed for mild pain, moderate pain or fever.    Marland Kitchen amLODipine (NORVASC) 10 MG tablet Take 10 mg by mouth daily.    Marland Kitchen aspirin EC 81  MG tablet Take 81 mg by mouth daily.    . Calcium Carbonate-Vitamin D (CALCIUM 600+D) 600-400 MG-UNIT tablet Take 2 tablets by mouth daily.    . diclofenac (VOLTAREN) 50 MG EC tablet Take 100 mg by mouth 2 (two) times daily as needed for mild pain or moderate pain.    . hydroxypropyl methylcellulose / hypromellose (ISOPTO TEARS / GONIOVISC) 2.5 % ophthalmic solution Place 1 drop into both eyes 2 (two) times daily.    . hydrOXYzine (ATARAX/VISTARIL) 25 MG tablet Take 25 mg by mouth every evening.    Marland Kitchen lisinopril-hydrochlorothiazide (PRINZIDE,ZESTORETIC) 20-25 MG tablet Take 2 tablets by mouth daily.    Marland Kitchen loratadine (CLARITIN) 10 MG tablet Take 10 mg by mouth daily as needed for allergies.    . Melatonin 3 MG TABS Take 6 mg by mouth every evening. Between 1800-2000    . Multiple Vitamins-Minerals (MULTIVITAMIN WITH MINERALS) tablet Take 1 tablet by mouth daily.    Marland Kitchen omeprazole (PRILOSEC) 20 MG capsule Take 20 mg by mouth daily.    . propranolol ER (INDERAL LA) 80 MG 24 hr capsule Take 160 mg by mouth daily.     . QUEtiapine (SEROQUEL) 100 MG tablet Take 100 mg by mouth at bedtime.    . sennosides-docusate sodium (SENOKOT-S) 8.6-50 MG tablet Take 2 tablets by mouth  daily. To prevent constipation     No current facility-administered medications for this visit.     Review of Systems Review of Systems  Constitutional: Negative.   Respiratory: Negative.   Cardiovascular: Negative.   Gastrointestinal: Negative for constipation and diarrhea.    Blood pressure 128/74, pulse 78, resp. rate 14, height 5\' 9"  (1.753 m), weight 153 lb (69.4 kg), SpO2 98 %.  Physical Exam Physical Exam  Constitutional: He is oriented to person, place, and time. He appears well-developed and well-nourished.  Eyes: Conjunctivae are normal. No scleral icterus.  Cardiovascular: Normal rate and regular rhythm.  Abdominal: Soft. No hernia. Hernia confirmed negative in the right inguinal area and confirmed negative in the  left inguinal area.    Neurological: He is alert and oriented to person, place, and time.  Skin: Skin is warm and dry.  Psychiatric: He has a normal mood and affect.    Data Reviewed Op note, previous notes  Assessment    S/p right inguinal hernia repair with Progrip mesh and partial omentectomy 01-16-17. Incision well healed without evidence of recurrence. Good post-operative course.      Plan    May resume physical activity without restrictions. Follow up as needed.      HPI, Physical Exam, Assessment and Plan have been scribed under the direction and in the presence of Mckinley Jewel, MD Karie Fetch, RN  I have completed the exam and reviewed the above documentation for accuracy and completeness.  I agree with the above.  Haematologist has been used and any errors in dictation or transcription are unintentional.  Eleina Jergens G. Jamal Collin, M.D., F.A.C.S.  Junie Panning G 02/22/2017, 11:21 AM

## 2017-09-22 IMAGING — CT CT CHEST W/ CM
1 of 2 series · 14 of 31 positions shown, 18 images · IV contrast (omnipaque)
Comparison: None; correlation chest radiograph 03/13/2015

CLINICAL DATA: Dementia, nasal congestion, cough and chills for 1
week, abnormal chest x-ray

EXAM:
CT CHEST WITH CONTRAST
TECHNIQUE: Multidetector CT imaging of the chest was performed during
intravenous contrast administration. Sagittal and coronal MPR images
reconstructed from axial data set.
CONTRAST:  75mL OMNIPAQUE IOHEXOL 300 MG/ML  SOLN IV

[Series 2: routine chest with · axial · 0.69mm/px · z∈[-552,-277]mm · 14 of 67 slices shown, 18 images]
[im 6/67  mediastinal]
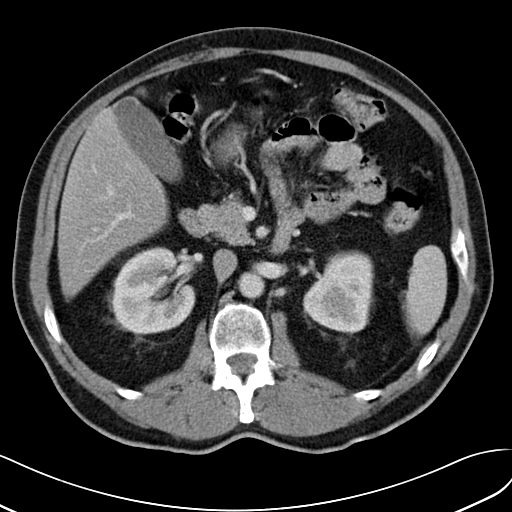
[im 6/67  lung]
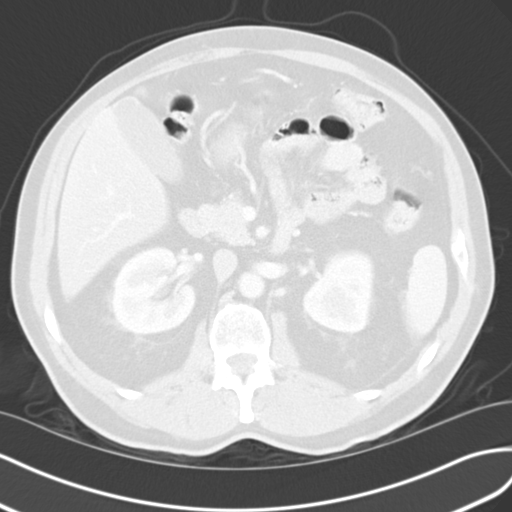
[im 11/67  lung]
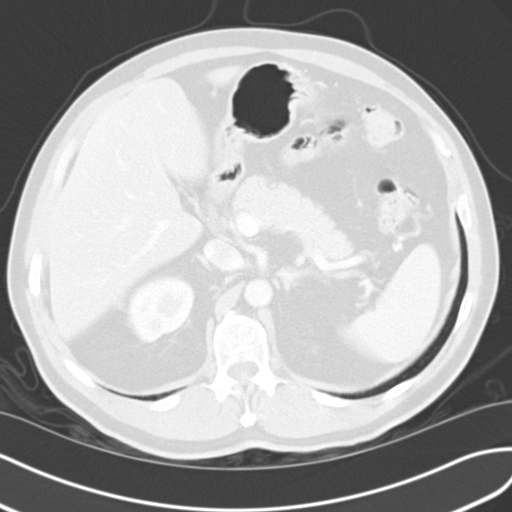
[im 16/67  lung]
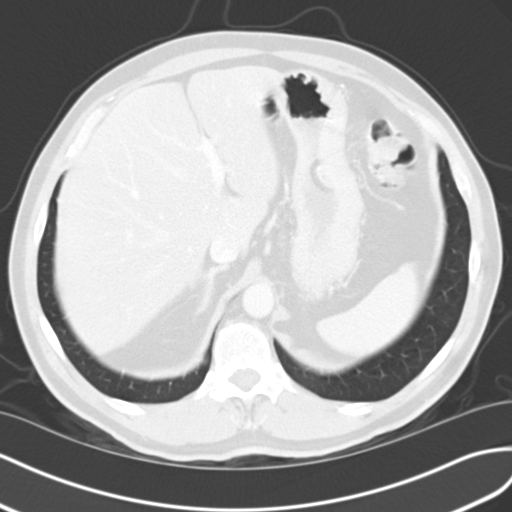
[im 21/67  lung]
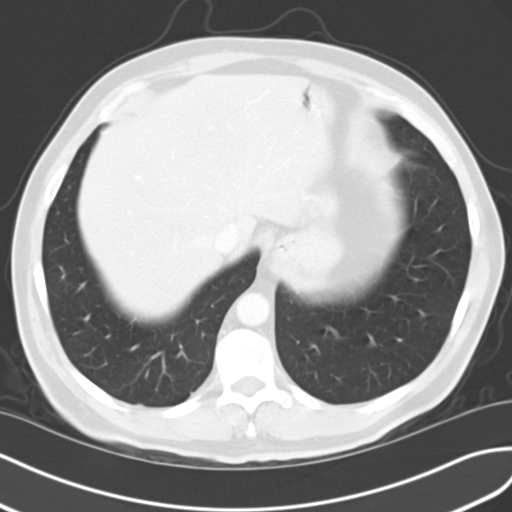
[im 26/67  mediastinal]
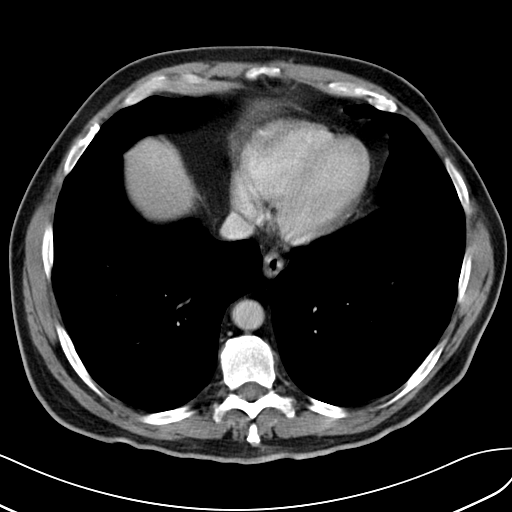
[im 26/67  lung]
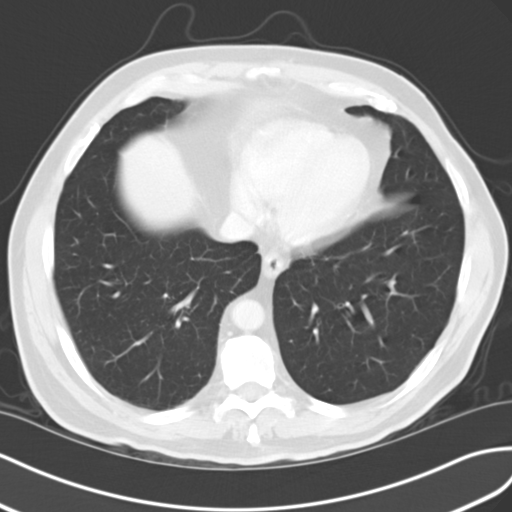
[im 31/67  lung]
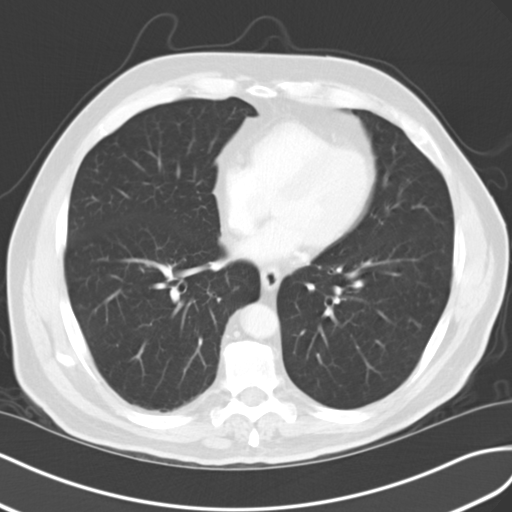
[im 32/67  lung]
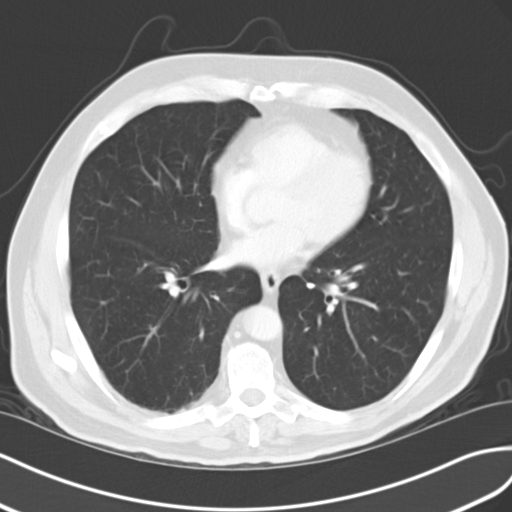
[im 34/67  lung]
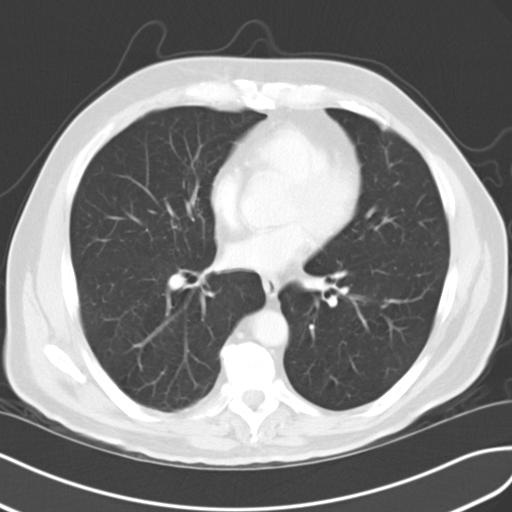
[im 36/67  mediastinal]
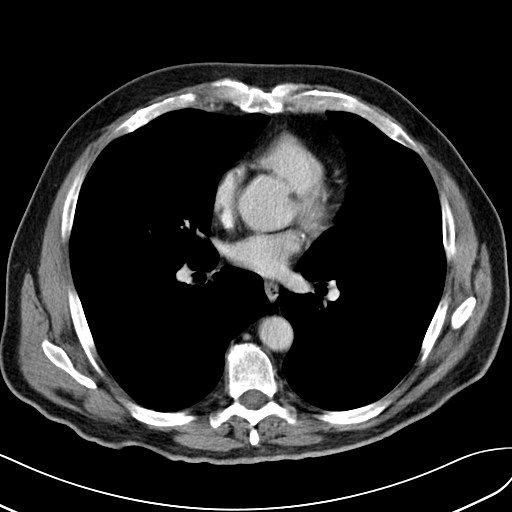
[im 36/67  lung]
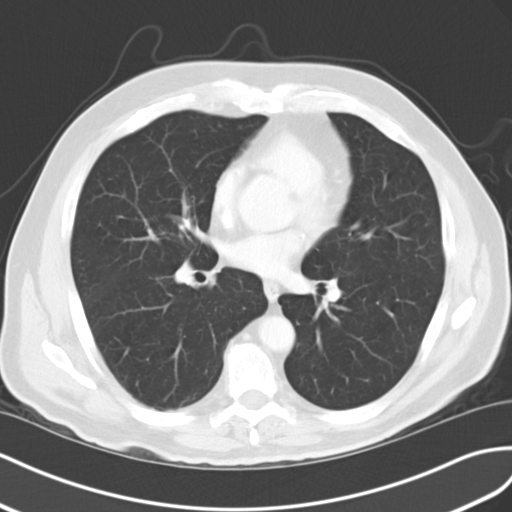
[im 41/67  lung]
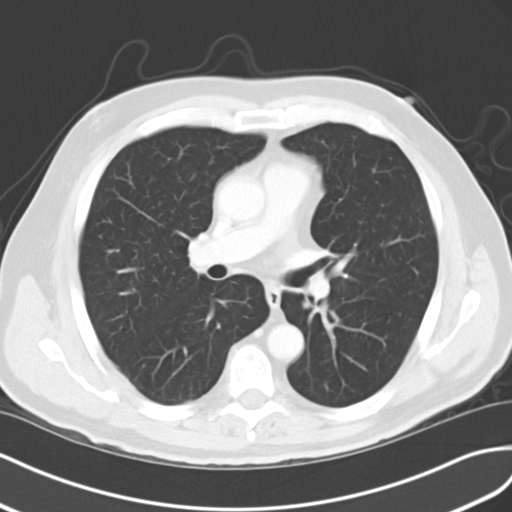
[im 46/67  lung]
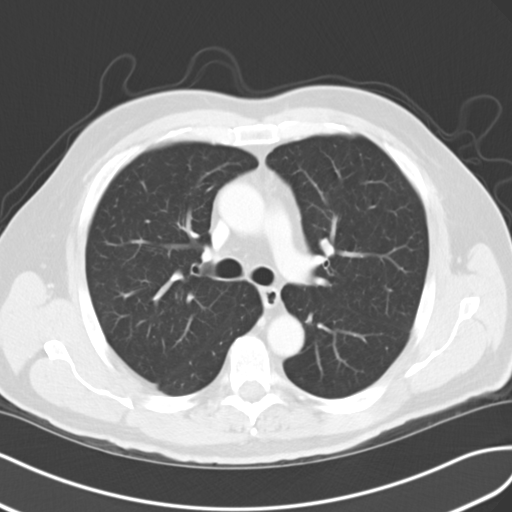
[im 51/67  lung]
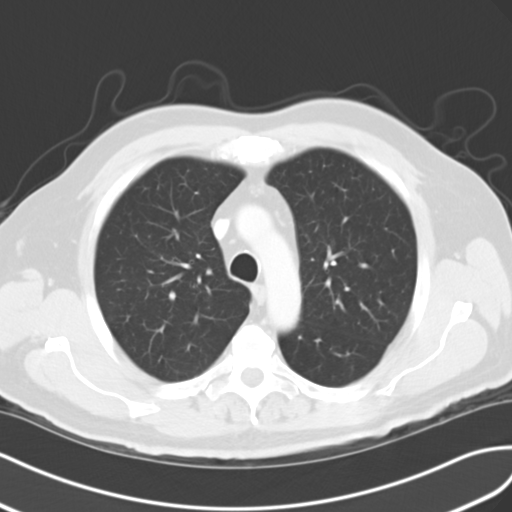
[im 56/67  mediastinal]
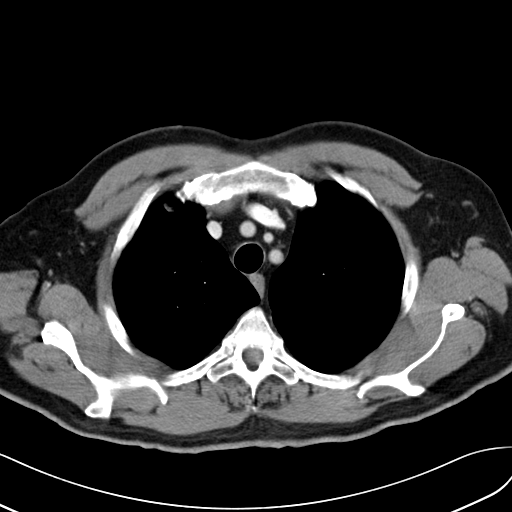
[im 56/67  lung]
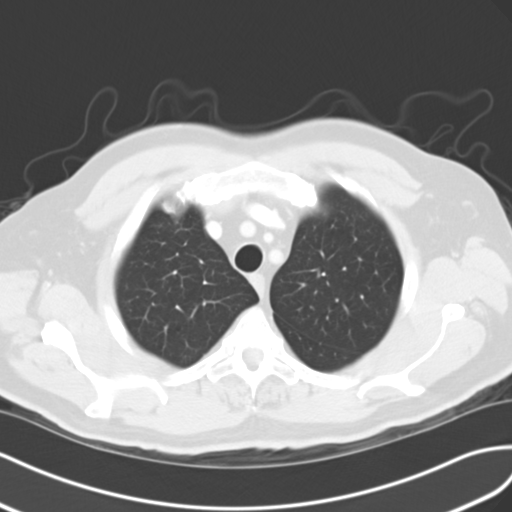
[im 61/67  lung]
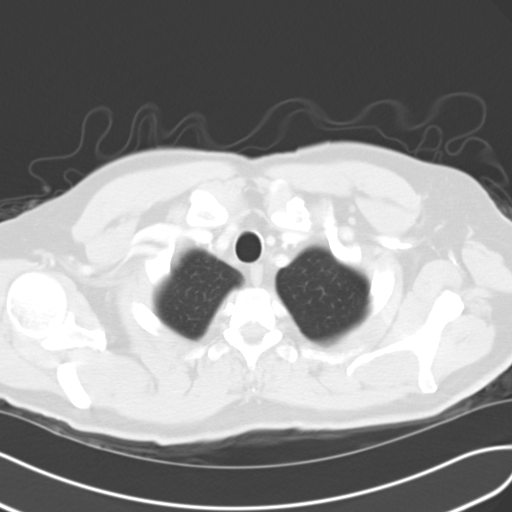

[14 of 31 positions shown; findings below may reference images not displayed]

FINDINGS: Thoracic vascular structures patent on nondedicated exam.

Atherosclerotic calcification aortic arch and coronary arteries.

No thoracic adenopathy.

Visualized upper abdomen unremarkable.

Lungs clear.

No pulmonary infiltrate, pleural effusion, pneumothorax or mass/
nodule.

Specifically no anterior lung base mass or opacity is identified to
account for radiographic findings.

Suspect radiographic abnormality was related to superimposed chest
wall structures.

No acute osseous findings.
IMPRESSION: Minimal atherosclerotic calcification aorta and coronary arteries.

Otherwise normal CT chest.

## 2017-12-27 ENCOUNTER — Ambulatory Visit: Payer: Medicare Other | Admitting: Surgery

## 2018-01-10 DIAGNOSIS — D485 Neoplasm of uncertain behavior of skin: Secondary | ICD-10-CM | POA: Diagnosis not present

## 2018-04-12 IMAGING — US US PELVIS LIMITED
1 series · 12 of 12 positions shown · non-contrast
Comparison: None.

CLINICAL DATA: Suspected right inguinal hernia due to bulging right
groin mass.

EXAM:
LIMITED ULTRASOUND OF PELVIS
TECHNIQUE: Limited transabdominal ultrasound examination of the pelvis was
performed.

[Series 1: us pelvis limited · 12 acquisitions, 12 frames shown]
[im 1/12]
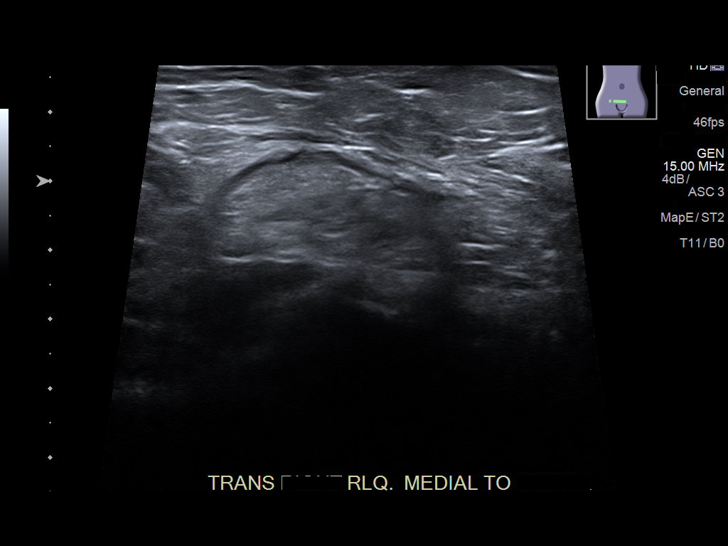
[im 2/12]
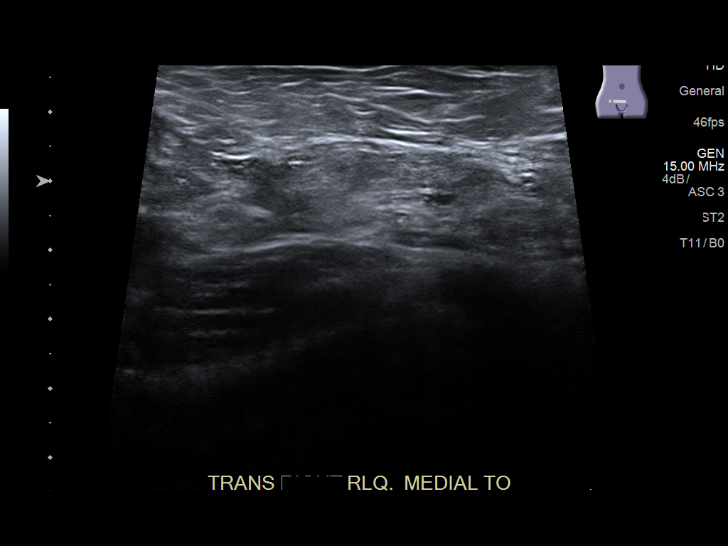
[im 3/12]
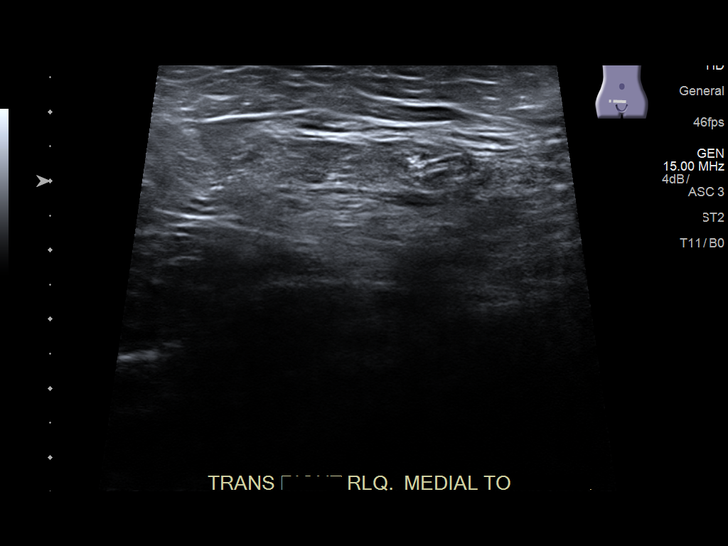
[im 4/12]
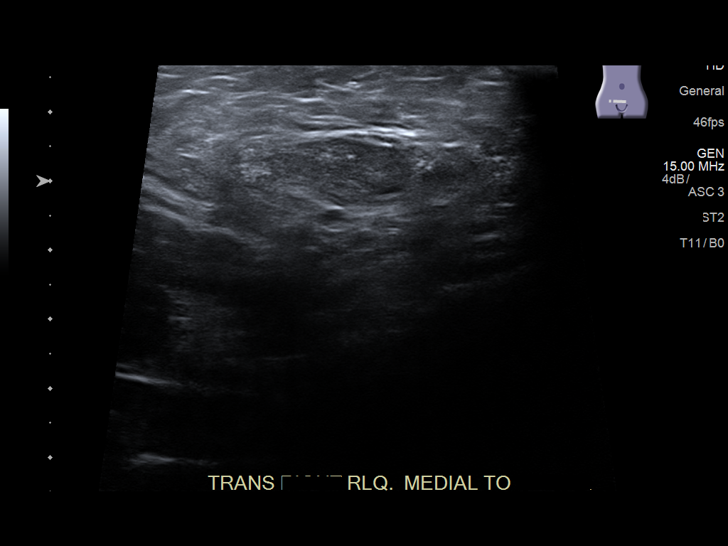
[im 5/12]
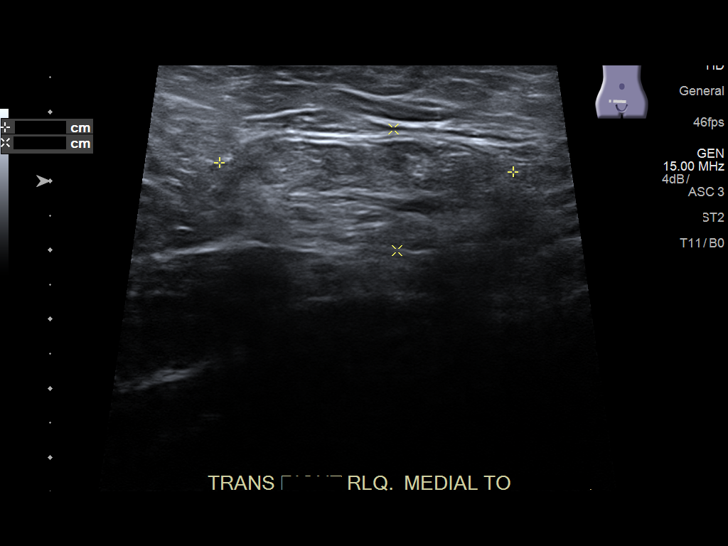
[im 6/12]
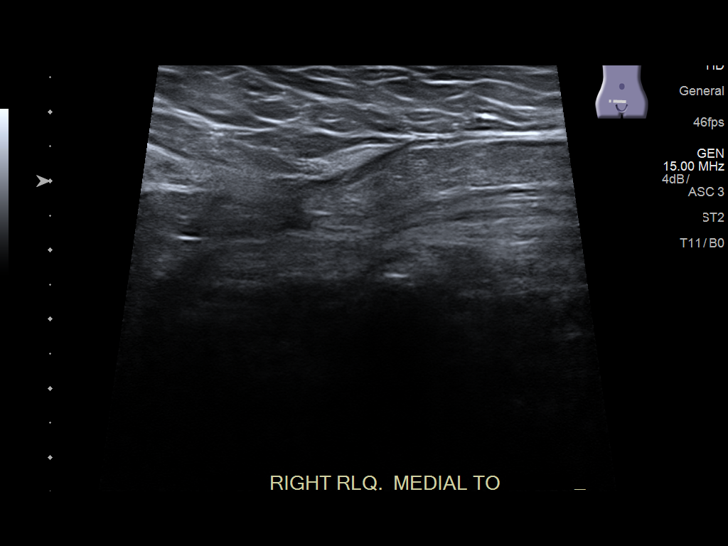
[im 7/12]
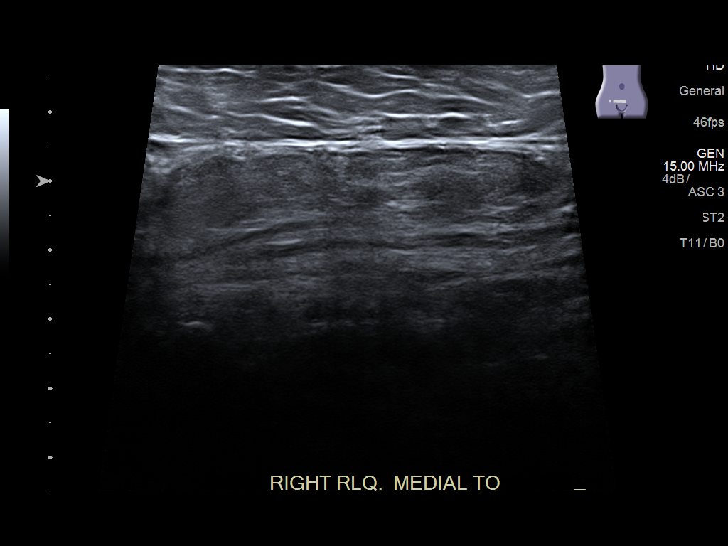
[im 8/12]
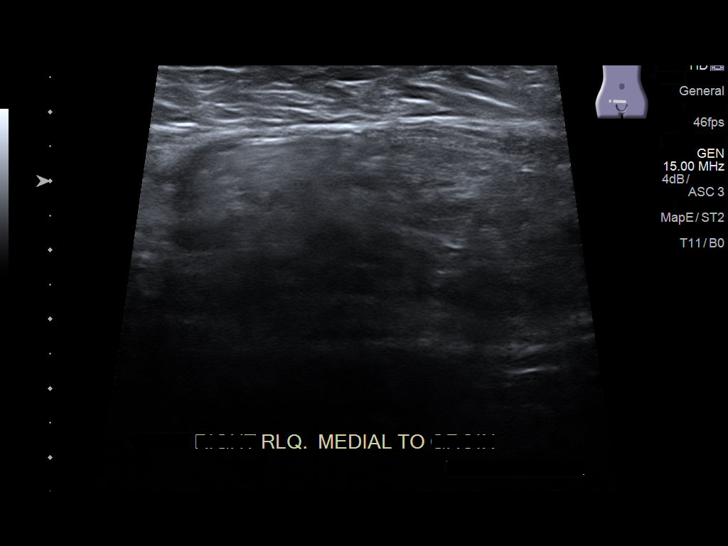
[im 9/12]
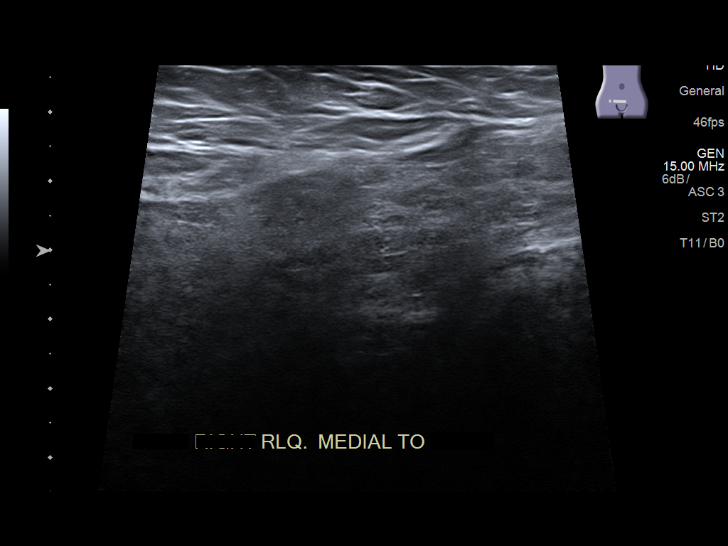
[im 10/12]
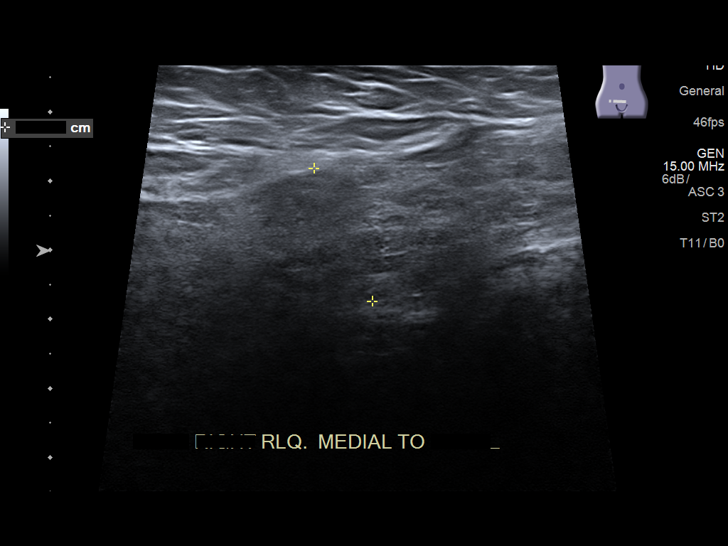
[im 11/12]
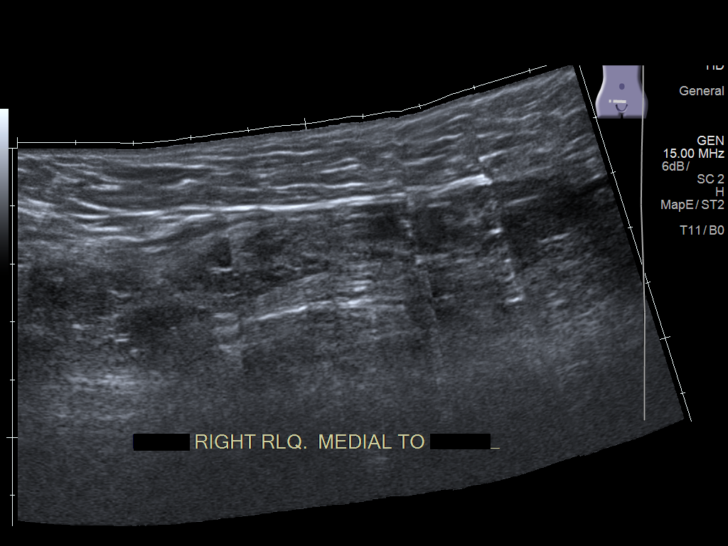
[im 12/12]
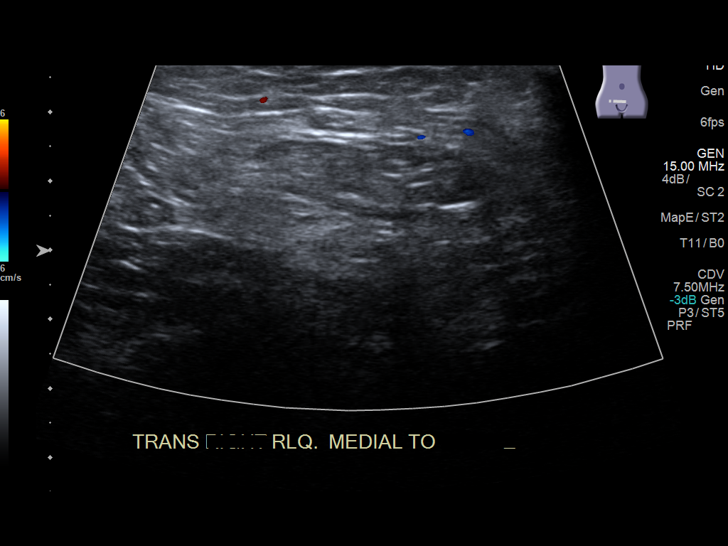

[12 of 12 positions shown; findings below may reference images not displayed]

FINDINGS: The area of interest in the right groin there is a soft tissue
density mass with evidence of peristalsis and vascularity compatible
with an inguinal hernia containing bowel. No abnormal cystic
structures are observed. No discrete lymph nodes are demonstrated.
IMPRESSION: Findings consistent with a right inguinal hernia containing bowel.

## 2019-11-21 ENCOUNTER — Telehealth: Payer: Self-pay

## 2019-11-21 NOTE — Telephone Encounter (Signed)
No answer, no Voicemail.

## 2020-04-06 ENCOUNTER — Encounter: Payer: Self-pay | Admitting: Orthopedic Surgery

## 2023-01-30 ENCOUNTER — Ambulatory Visit (INDEPENDENT_AMBULATORY_CARE_PROVIDER_SITE_OTHER): Payer: Medicare Other | Admitting: Family Medicine

## 2023-01-30 ENCOUNTER — Encounter (HOSPITAL_BASED_OUTPATIENT_CLINIC_OR_DEPARTMENT_OTHER): Payer: Self-pay | Admitting: Family Medicine

## 2023-01-30 VITALS — BP 163/89 | HR 64 | Ht 65.0 in | Wt 145.4 lb

## 2023-01-30 DIAGNOSIS — R768 Other specified abnormal immunological findings in serum: Secondary | ICD-10-CM | POA: Insufficient documentation

## 2023-01-30 DIAGNOSIS — Z87820 Personal history of traumatic brain injury: Secondary | ICD-10-CM

## 2023-01-30 DIAGNOSIS — R410 Disorientation, unspecified: Secondary | ICD-10-CM | POA: Diagnosis not present

## 2023-01-30 DIAGNOSIS — Z8619 Personal history of other infectious and parasitic diseases: Secondary | ICD-10-CM

## 2023-01-30 DIAGNOSIS — K219 Gastro-esophageal reflux disease without esophagitis: Secondary | ICD-10-CM | POA: Insufficient documentation

## 2023-01-30 DIAGNOSIS — Z720 Tobacco use: Secondary | ICD-10-CM | POA: Insufficient documentation

## 2023-01-30 DIAGNOSIS — F1410 Cocaine abuse, uncomplicated: Secondary | ICD-10-CM | POA: Insufficient documentation

## 2023-01-30 DIAGNOSIS — F259 Schizoaffective disorder, unspecified: Secondary | ICD-10-CM | POA: Insufficient documentation

## 2023-01-30 DIAGNOSIS — B351 Tinea unguium: Secondary | ICD-10-CM

## 2023-01-30 DIAGNOSIS — K4090 Unilateral inguinal hernia, without obstruction or gangrene, not specified as recurrent: Secondary | ICD-10-CM | POA: Insufficient documentation

## 2023-01-30 DIAGNOSIS — F1920 Other psychoactive substance dependence, uncomplicated: Secondary | ICD-10-CM | POA: Insufficient documentation

## 2023-01-30 DIAGNOSIS — F319 Bipolar disorder, unspecified: Secondary | ICD-10-CM | POA: Insufficient documentation

## 2023-01-30 DIAGNOSIS — B1710 Acute hepatitis C without hepatic coma: Secondary | ICD-10-CM

## 2023-01-30 DIAGNOSIS — F602 Antisocial personality disorder: Secondary | ICD-10-CM | POA: Insufficient documentation

## 2023-01-30 DIAGNOSIS — G43909 Migraine, unspecified, not intractable, without status migrainosus: Secondary | ICD-10-CM

## 2023-01-30 DIAGNOSIS — L600 Ingrowing nail: Secondary | ICD-10-CM

## 2023-01-30 DIAGNOSIS — I10 Essential (primary) hypertension: Secondary | ICD-10-CM

## 2023-01-30 DIAGNOSIS — J309 Allergic rhinitis, unspecified: Secondary | ICD-10-CM

## 2023-01-30 DIAGNOSIS — F3342 Major depressive disorder, recurrent, in full remission: Secondary | ICD-10-CM | POA: Insufficient documentation

## 2023-01-30 DIAGNOSIS — Z5900 Homelessness unspecified: Secondary | ICD-10-CM

## 2023-01-30 DIAGNOSIS — R45851 Suicidal ideations: Secondary | ICD-10-CM | POA: Insufficient documentation

## 2023-01-30 DIAGNOSIS — R4189 Other symptoms and signs involving cognitive functions and awareness: Secondary | ICD-10-CM

## 2023-01-30 DIAGNOSIS — E7800 Pure hypercholesterolemia, unspecified: Secondary | ICD-10-CM | POA: Insufficient documentation

## 2023-01-30 DIAGNOSIS — F39 Unspecified mood [affective] disorder: Secondary | ICD-10-CM | POA: Insufficient documentation

## 2023-01-30 DIAGNOSIS — F1020 Alcohol dependence, uncomplicated: Secondary | ICD-10-CM

## 2023-01-30 DIAGNOSIS — Z23 Encounter for immunization: Secondary | ICD-10-CM | POA: Diagnosis not present

## 2023-01-30 DIAGNOSIS — F1420 Cocaine dependence, uncomplicated: Secondary | ICD-10-CM | POA: Insufficient documentation

## 2023-01-30 DIAGNOSIS — J45909 Unspecified asthma, uncomplicated: Secondary | ICD-10-CM | POA: Insufficient documentation

## 2023-01-30 HISTORY — DX: Migraine, unspecified, not intractable, without status migrainosus: G43.909

## 2023-01-30 HISTORY — DX: Homelessness unspecified: Z59.00

## 2023-01-30 HISTORY — DX: Ingrowing nail: L60.0

## 2023-01-30 HISTORY — DX: Acute hepatitis C without hepatic coma: B17.10

## 2023-01-30 HISTORY — DX: Alcohol dependence, uncomplicated: F10.20

## 2023-01-30 HISTORY — DX: Major depressive disorder, recurrent, in full remission: F33.42

## 2023-01-30 HISTORY — DX: Tinea unguium: B35.1

## 2023-01-30 HISTORY — DX: Allergic rhinitis, unspecified: J30.9

## 2023-01-30 HISTORY — DX: Cocaine dependence, uncomplicated: F14.20

## 2023-01-30 NOTE — Progress Notes (Signed)
New Patient Office Visit  Subjective:   Carl Ortega 1951-01-31 01/30/2023  Chief Complaint  Patient presents with   New Patient (Initial Visit)    Patient is here today to get established with the practice. Patient needs to have an evaluation done to rule out any mental health problems (alzheimer's, etc).    HPI: Carl Ortega presents today to establish care at [facility] at [facility]. Introduced to Publishing rights manager role and practice setting.  All questions answered.   Last PCP: [facility] Last annual physical: Several years ago Concerns: See below   COGNITIVE IMPAIRMENT:  Patient is accompanied by his friend today to establish care.  He was previously managed by the [place] in [place].  Per friend, he is not a reliable historian regarding his chronic medical conditions and would also like to establish a PCP with [place].  Per friend [facility] sent email to patient stating that he needed evaluation for possible dementia, TBI or cognitive impairment prior to obtaint a Clinical comprehensive assessment. They are requiring a neurological and psychological evaluations by specialists for mental competency for court proceedings in his divorce to prove competence.  Patient is not undergoing treatment for substance abuse or alcohol dependence with [facility] at this time.  Friend states his ex-wife is bringing lawsuit against the patient after abandoning him several years ago. Court is trying to determine if paitnet is able to care for himself independtly or if he needs a fulltime caregiver.  He lives with his friend right now. He states he does have a hx of TBI in the past while in service in [employer]	[place].  Patient and family report increasing instances of mood changes, forgetfulness, and concern for possible cognitive decline.  Patient denies dysuria, urinary frequency, hematuria, fevers or chills, abdominal pain, nausea or vomiting, recent falls or head  trauma.  PCP unable to see chart records from the [place] and we will request this with records release.  Patient is unaware of possible hepatitis C positive antibody as listed in his medical history.  Will repeat testing today.  He is asymptomatic at this time.  The following portions of the patient's history were reviewed and updated as appropriate: past medical history, past surgical history, family history, social history, allergies, medications, and problem list.   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, abuse (HCC) 01/30/2023   Episodic mood disorder (HCC) 01/30/2023   Bipolar I disorder (HCC) 01/30/2023   Asthma 01/30/2023   Antisocial personality disorder (HCC) 01/30/2023   Alcohol dependence, continuous (HCC) 01/30/2023   Acute hepatitis C 01/30/2023   Chronic schizoaffective schizophrenia (HCC) 01/30/2023   Alcohol dependence (HCC) 01/30/2023   Cognitive impairment 01/30/2023   Hernia of abdominal cavity 10/30/2016   MDD (major depressive disorder), recurrent severe, 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   Adjustment disorder with disturbance of emotion 04/02/2015   Hyponatremia 03/13/2015   PTSD (post-traumatic stress disorder) 12/03/2014   Hypertension 12/03/2014   Cocaine abuse (HCC) 12/03/2014   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   Pure hypertriglyceridemia 03/28/1999   Past Medical History:  Diagnosis Date   Allergic rhinitis 01/30/2023   Bipolar 1 disorder (HCC)    Brain injury (HCC)    Cocaine dependence, continuous (HCC) 01/30/2023   Dermatophytosis of  nail 01/30/2023   GERD (gastroesophageal reflux disease)    Homeless 01/30/2023   Hypertension    Ingrowing nail 01/30/2023   Major depressive disorder, recurrent episode, in full  remission (HCC) 01/30/2023   Migraine 01/30/2023   PTSD (post-traumatic stress disorder)    PTSD (post-traumatic stress disorder)    Past Surgical History:  Procedure Laterality Date   COLONOSCOPY WITH PROPOFOL N/A 01/10/2017   Procedure: COLONOSCOPY WITH PROPOFOL;  Surgeon: Kieth Brightly, MD;  Location: [facility] END[facility]COPY;  Service: Endoscopy;  Laterality: N/A;   HERNIA REPAIR  2010   left groin   INGUINAL HERNIA REPAIR Right 01/16/2017   Procedure: HERNIA REPAIR INGUINAL ADULT WITH PARTIAL OMENTECTOMY;  Surgeon: Kieth Brightly, MD;  Location: [facility] ORS;  Service: General;  Laterality: Right;   none     Family History  Problem Relation Age of Onset   Hypertension Other    Social History   Socioeconomic History   Marital status: Married    Spouse name: Not on file   Number of children: Not on file   Years of education: Not on file   Highest education level: Not on file  Occupational History   Not on file  Tobacco Use   Smoking status: Every Day    Current packs/day: 0.25    Types: Cigarettes   Smokeless tobacco: Former  Advertising account planner   Vaping status: Never Used  Substance and Sexual Activity   Alcohol use: Yes    Alcohol/week: 4.0 standard drinks of alcohol    Types: 4 Standard drinks or equivalent per week    Comment: denies regular use   Drug use: Yes    Types: Cocaine    Comment: patient states 7-8 years since last use   Sexual activity: Not on file  Other Topics Concern   Not on file  Social History Narrative   Not on file   Social Determinants of Health   Financial Resource Strain: Low Risk  (01/30/2023)   Overall Financial Resource Strain (CARDIA)    Difficulty of Paying Living Expenses: Not very hard  Food Insecurity: No Food Insecurity (01/30/2023)   Hunger Vital Sign    Worried About Running Out of Food in the Last Year: Never true    Ran Out of Food in the Last Year: Never true  Transportation Needs: No Transportation Needs (01/30/2023)    PRAPARE - Administrator, Civil Service (Medical): No    Lack of Transportation (Non-Medical): No  Physical Activity: Inactive (01/30/2023)   Exercise Vital Sign    Days of Exercise per Week: 0 days    Minutes of Exercise per Session: 0 min  Stress: No Stress Concern Present (01/30/2023)   [employer] of Occupational Health - Occupational Stress Questionnaire    Feeling of Stress : Not at all  Social Connections: Socially Isolated (01/30/2023)   Social Connection and Isolation Panel [NHANES]    Frequency of Communication with Friends and Family: Never    Frequency of Social Gatherings with Friends and Family: Never    Attends Religious Services: Never    Database administrator or Organizations: No    Attends Banker Meetings: Never    Marital Status: Separated  Intimate Partner Violence: Not At Risk (01/30/2023)   Humiliation, Afraid, Rape, and Kick questionnaire    Fear of Current or Ex-Partner: No    Emotionally Abused: No    Physically Abused: No    Sexually Abused: No   Outpatient Medications  Prior to Visit  Medication Sig Dispense Refill   acetaminophen (TYLENOL) 325 MG tablet Take 650 mg by mouth 3 (three) times daily as needed for mild pain, moderate pain or fever.     amLODipine (NORVASC) 10 MG tablet Take 10 mg by mouth daily.     aspirin EC 81 MG tablet Take 81 mg by mouth daily.     Calcium Carbonate-Vitamin D (CALCIUM 600+D) 600-400 MG-UNIT tablet Take 2 tablets by mouth daily.     diclofenac (VOLTAREN) 50 MG EC tablet Take 100 mg by mouth 2 (two) times daily as needed for mild pain or moderate pain.     hydroxypropyl methylcellulose / hypromellose (ISOPTO TEARS / GONIOVISC) 2.5 % ophthalmic solution Place 1 drop into both eyes 2 (two) times daily.     hydrOXYzine (ATARAX/VISTARIL) 25 MG tablet Take 25 mg by mouth every evening.     lisinopril-hydrochlorothiazide (PRINZIDE,ZESTORETIC) 20-25 MG tablet Take 2 tablets by mouth daily.      loratadine (CLARITIN) 10 MG tablet Take 10 mg by mouth daily as needed for allergies.     Melatonin 3 MG TABS Take 6 mg by mouth every evening. Between 1800-2000     Multiple Vitamins-Minerals (MULTIVITAMIN WITH MINERALS) tablet Take 1 tablet by mouth daily.     omeprazole (PRILOSEC) 20 MG capsule Take 20 mg by mouth daily.     propranolol ER (INDERAL LA) 80 MG 24 hr capsule Take 160 mg by mouth daily.      QUEtiapine (SEROQUEL) 100 MG tablet Take 100 mg by mouth at bedtime.     sennosides-docusate sodium (SENOKOT-S) 8.6-50 MG tablet Take 2 tablets by mouth daily. To prevent constipation     No facility-administered medications prior to visit.   Allergies  Allergen Reactions   Ketorolac Rash    ROS: A complete ROS was performed with pertinent positives/negatives noted in the HPI. The remainder of the ROS are negative.   Objective:   Today's Vitals   01/30/23 1040 01/30/23 1144  BP: (!) 166/92 (!) 163/89  Pulse: 64   SpO2: 95%   Weight: 145 lb 6.4 oz (66 kg)   Height: 5\' 5"  (1.651 m)     GENERAL: Well-appearing, in NAD. Well nourished.  SKIN: Pink, warm and dry. No rash, lesion, ulceration, or ecchymoses.  Head: Normocephalic. NECK: Trachea midline. Full ROM w/o pain or tenderness.  RESPIRATORY: Chest wall symmetrical. Respirations even and non-labored. Breath sounds clear to auscultation bilaterally.  CARDIAC: S1, S2 present, regular rate and rhythm without murmur or gallops.  EXTREMITIES: Without clubbing, cyanosis, or edema.  NEUROLOGIC: No motor or sensory deficits. Steady, even gait. C2-C12 intact.  PSYCH/MENTAL STATUS: Alert, oriented x person and place only. Flat and withdrawn mood and affect. Hearing impaired.   MMSE Score: 21 - Pt states he did complete degree at [employer] as highest level of education  Health Maintenance Due  Topic Date Due   Medicare Annual Wellness (AWV)  Never done   Zoster Vaccines- Shingrix (2 of 2) 01/10/2021   DTaP/Tdap/Td (2 - Td  or Tdap) 05/07/2022   COVID-19 Vaccine (4 - 2023-24 season) 11/26/2022    No results found for any visits on 01/30/23.     Assessment & Plan:  1. Encounter for immunization - Flu Vaccine Trivalent High Dose (Fluad)  2. Cognitive impairment 3. Disorientation 4. Hx of traumatic brain injury Given patient's significant history of mental health disorders, cocaine and alcohol dependence, will obtain lab work for possible cirrhosis with elevated  ammonia levels and S encephalitis risk, HIV, hepatitis C antibody and RPR.  Will also obtain levels to rule out possible inflammatory disorder, anemia, thyroid disorder that may be causing patient's symptoms.  Will obtain CT head at [facility] imaging to rule out acute issues as well and referral is also placed to neuropsychology for further evaluation.  I believe neuropsychology will also be able to evaluate and provide that documentation for court records.  If not, we can refer him to psychiatry.  Pending patient's labs and CT, I did discuss possibly starting patient on Aricept and patient and family were agreeable to this.  He is currently cared for by friend who accompanied him to this appointment. - Ammonia - CBC with Differential/Platelet - Comprehensive metabolic panel - Hemoglobin A1c - Hepatitis C antibody - HIV Antibody (routine testing w rflx) - Sedimentation rate - TSH - PSA - Lipid panel - RPR - C-reactive protein - CT HEAD WO CONTRAST ( ); Future - Ambulatory referral to Neuropsychology  5. Hx of hepatitis C Records have been requested from the [place] in [place] given patient's history of hepatitis C on his medical history.  Patient is unaware of this and is a poor historian.  Will repeat hepatitis C antibody and HCVRNA quant today with lab work. - Hepatitis C antibody - HCV RNA quant  6. Primary hypertension Uncontrolled, patient did not take his medication prior to coming this morning.  Will follow-up in approximately 4 weeks and  patient advised to take this medication prior to his appointment.  Patient to reach out to office if new, worrisome, or unresolved symptoms arise or if no improvement in patient's condition. Patient verbalized understanding and is agreeable to treatment plan. All questions answered to patient's satisfaction.    Return in about 4 weeks (around 02/27/2023) for Cognitive Follow up .    Yolanda Manges, FNP

## 2023-01-31 LAB — CBC WITH DIFFERENTIAL/PLATELET
Basophils Absolute: 0.1 10*3/uL (ref 0.0–0.2)
Basos: 1 %
EOS (ABSOLUTE): 0.1 10*3/uL (ref 0.0–0.4)
Eos: 2 %
Hematocrit: 49.4 % (ref 37.5–51.0)
Hemoglobin: 16.2 g/dL (ref 13.0–17.7)
Immature Grans (Abs): 0 10*3/uL (ref 0.0–0.1)
Immature Granulocytes: 0 %
Lymphocytes Absolute: 2.7 10*3/uL (ref 0.7–3.1)
Lymphs: 38 %
MCH: 32.9 pg (ref 26.6–33.0)
MCHC: 32.8 g/dL (ref 31.5–35.7)
MCV: 100 fL — ABNORMAL HIGH (ref 79–97)
Monocytes Absolute: 0.9 10*3/uL (ref 0.1–0.9)
Monocytes: 12 %
Neutrophils Absolute: 3.4 10*3/uL (ref 1.4–7.0)
Neutrophils: 47 %
Platelets: 237 10*3/uL (ref 150–450)
RBC: 4.93 x10E6/uL (ref 4.14–5.80)
RDW: 12.4 % (ref 11.6–15.4)
WBC: 7.2 10*3/uL (ref 3.4–10.8)

## 2023-01-31 LAB — COMPREHENSIVE METABOLIC PANEL
ALT: 21 [IU]/L (ref 0–44)
AST: 34 [IU]/L (ref 0–40)
Albumin: 4.4 g/dL (ref 3.8–4.8)
Alkaline Phosphatase: 84 [IU]/L (ref 44–121)
BUN/Creatinine Ratio: 17 (ref 10–24)
BUN: 14 mg/dL (ref 8–27)
Bilirubin Total: 0.4 mg/dL (ref 0.0–1.2)
CO2: 23 mmol/L (ref 20–29)
Calcium: 9.7 mg/dL (ref 8.6–10.2)
Chloride: 98 mmol/L (ref 96–106)
Creatinine, Ser: 0.83 mg/dL (ref 0.76–1.27)
Globulin, Total: 2.3 g/dL (ref 1.5–4.5)
Glucose: 75 mg/dL (ref 70–99)
Potassium: 3.9 mmol/L (ref 3.5–5.2)
Sodium: 138 mmol/L (ref 134–144)
Total Protein: 6.7 g/dL (ref 6.0–8.5)
eGFR: 93 mL/min/{1.73_m2} (ref >59)

## 2023-01-31 LAB — HEPATITIS C ANTIBODY: Hep C Virus Ab: REACTIVE — AB

## 2023-01-31 LAB — AMMONIA: Ammonia: 121 ug/dL (ref 31–169)

## 2023-01-31 LAB — TSH: TSH: 1.54 u[IU]/mL (ref 0.450–4.500)

## 2023-01-31 LAB — HEMOGLOBIN A1C
Est. average glucose Bld gHb Est-mCnc: 105 mg/dL
Hgb A1c MFr Bld: 5.3 % (ref 4.8–5.6)

## 2023-01-31 LAB — HCV RNA QUANT: Hepatitis C Quantitation: NOT DETECTED [IU]/mL

## 2023-01-31 LAB — SEDIMENTATION RATE: Sed Rate: 7 mm/h (ref 0–30)

## 2023-01-31 LAB — LIPID PANEL
Chol/HDL Ratio: 5.8 ratio — ABNORMAL HIGH (ref 0.0–5.0)
Cholesterol, Total: 257 mg/dL — ABNORMAL HIGH (ref 100–199)
HDL: 44 mg/dL (ref >39)
LDL Chol Calc (NIH): 143 mg/dL — ABNORMAL HIGH (ref 0–99)
Triglycerides: 379 mg/dL — ABNORMAL HIGH (ref 0–149)
VLDL Cholesterol Cal: 70 mg/dL — ABNORMAL HIGH (ref 5–40)

## 2023-01-31 LAB — C-REACTIVE PROTEIN: CRP: <1 mg/L (ref 0–10)

## 2023-01-31 LAB — RPR: RPR Ser Ql: NONREACTIVE

## 2023-01-31 LAB — PSA: Prostate Specific Ag, Serum: 0.7 ng/mL (ref 0.0–4.0)

## 2023-01-31 LAB — HIV ANTIBODY (ROUTINE TESTING W REFLEX): HIV Screen 4th Generation wRfx: NONREACTIVE

## 2023-02-05 ENCOUNTER — Inpatient Hospital Stay
Admission: RE | Admit: 2023-02-05 | Discharge: 2023-02-05 | Disposition: A | Discharge status: Home / Self Care | Payer: Medicare Other | Source: Ambulatory Visit | Attending: Family Medicine | Admitting: Family Medicine

## 2023-02-05 DIAGNOSIS — R410 Disorientation, unspecified: Secondary | ICD-10-CM

## 2023-02-05 DIAGNOSIS — R4189 Other symptoms and signs involving cognitive functions and awareness: Secondary | ICD-10-CM

## 2023-02-06 ENCOUNTER — Other Ambulatory Visit (HOSPITAL_BASED_OUTPATIENT_CLINIC_OR_DEPARTMENT_OTHER): Payer: Self-pay | Admitting: Family Medicine

## 2023-02-06 MED ORDER — ATORVASTATIN CALCIUM 40 MG PO TABS: 40.0000 mg | ORAL_TABLET | Freq: Every evening | ORAL | 3 refills | Status: DC

## 2023-02-06 NOTE — Progress Notes (Signed)
Hi Carl Ortega,  Your lab work overall was unremarkable for any concerning issues that may be related to memory and cognitive impairment.  Your blood counts were normal, your ammonia, HIV, sed rate, TSH and prostate level was normal.  You were negative for syphilis or any STD.  Your electrolytes, kidney and liver function was normal.  You are not in the prediabetic category or diabetic category based upon your A1c.  Your hepatitis C antibody was positive, but your hepatitis C quantitative level was negative.  This indicates that you have been exposed to hepatitis C at some point in your life but you have cleared the infection and you do not currently have infection.  Your cholesterol level was significantly elevated and I would recommend starting on a medication called atorvastatin to help lower your cholesterol and lower your risk of heart attack or stroke.  I saw that you had the CT head scan yesterday and we are awaiting the results.  I will let you know what that says when they return.  I would also proceed with following up with neuropsychology as recommended.  Please go ahead and pick up the medication and start taking nightly and we will follow-up at your appointment in December.

## 2023-02-19 NOTE — Progress Notes (Signed)
Hi Carl Ortega, Your head CT is normal.  There were no findings that were able to be attributed to cognitive changes.  We can discuss this further at your upcoming appointment.

## 2023-02-28 ENCOUNTER — Encounter (HOSPITAL_BASED_OUTPATIENT_CLINIC_OR_DEPARTMENT_OTHER): Payer: Self-pay | Admitting: Family Medicine

## 2023-02-28 ENCOUNTER — Ambulatory Visit (INDEPENDENT_AMBULATORY_CARE_PROVIDER_SITE_OTHER): Payer: Medicare Other | Admitting: Family Medicine

## 2023-02-28 VITALS — BP 194/92 | HR 72 | Ht 65.0 in | Wt 148.9 lb

## 2023-02-28 DIAGNOSIS — Z659 Problem related to unspecified psychosocial circumstances: Secondary | ICD-10-CM | POA: Insufficient documentation

## 2023-02-28 DIAGNOSIS — E782 Mixed hyperlipidemia: Secondary | ICD-10-CM | POA: Insufficient documentation

## 2023-02-28 DIAGNOSIS — I10 Essential (primary) hypertension: Secondary | ICD-10-CM | POA: Diagnosis not present

## 2023-02-28 DIAGNOSIS — R4189 Other symptoms and signs involving cognitive functions and awareness: Secondary | ICD-10-CM

## 2023-02-28 MED ORDER — AMLODIPINE BESYLATE 10 MG PO TABS: 10.0000 mg | ORAL_TABLET | Freq: Every day | ORAL | 3 refills | Status: DC

## 2023-02-28 MED ORDER — CLONIDINE HCL 0.1 MG PO TABS
0.1000 mg | ORAL_TABLET | Freq: Once | ORAL | Status: AC
  Administered 2023-02-28: 0.1 mg via ORAL

## 2023-02-28 MED ORDER — QUETIAPINE FUMARATE 100 MG PO TABS: 100.0000 mg | ORAL_TABLET | Freq: Every day | ORAL | 3 refills | Status: DC

## 2023-02-28 MED ORDER — LISINOPRIL-HYDROCHLOROTHIAZIDE 20-25 MG PO TABS: 2.0000 | ORAL_TABLET | Freq: Every day | ORAL | 3 refills | Status: DC

## 2023-02-28 MED ORDER — PROPRANOLOL HCL ER 80 MG PO CP24: 160.0000 mg | ORAL_CAPSULE | Freq: Every day | ORAL | 3 refills | Status: AC

## 2023-02-28 MED ORDER — HYDRALAZINE HCL 10 MG PO TABS: 10.0000 mg | ORAL_TABLET | Freq: Two times a day (BID) | ORAL | 3 refills | Status: AC | PRN

## 2023-02-28 NOTE — Patient Instructions (Signed)
Pick up your Blood Pressure Cuff today.    Take 1 tablet of hydralazine and check BP in  1 hour. If over 160/90, call the office.

## 2023-02-28 NOTE — Progress Notes (Signed)
Subjective:   Carl Ortega [birth year] 02/28/2023  Chief Complaint  Patient presents with   Medical Management of Chronic Issues    4-week follow up; patient and guardians state that pt has been doing better since last visit.    HPI: [name] presents today for re-assessment and management of chronic medical conditions.   COGNITIVE IMPAIRMENT:  Patient has cognitive impairment post TBI. [facility].  Patient is still living his caregiver Janeal Holmes and Bayard Males who accompany him today on his appt. They report patient is doing well. He has not had further declined of congitive function and have set up program of Comprehensive assistance for Caregiver with the [place]. They help to manage [name]'s medications, appts, nutrition, and ADLs. Patient is able to independently bathe, eat, toilet, dress and ambulate without assistance.   MMSE 02/27/22: 20  HYPERTENSION: Patrick Hoskinson presents for the medical management of hypertension. Patient's caregiver present in room believes his medications have been expired for several years. He helps patietn take his medications daily and states he did take all BP medications today. He reports [facility].  Patient's current hypertension medication regimen is: Amlodipine 10mg  every day, Lisinopril-hydrochlorothiazide 20-25mg  (two tablets once daily), Propanolol 160mg  ER daily Patient is  currently taking prescribed medications for HTN.  Patient is not regularly keeping a check on BP at home. Patient does not have BP cuff.  Adhering to low sodium diet: No. Patient eats frozen meals frequently. Caregiver's family does cook healthy meals, but patient refuses to eat them.  Exercising Regularly: No  Denies headache, dizziness, CP, SHOB, vision changes.    BP Readings from Last 3 Encounters:  02/28/23 (!) 194/92  01/30/23 (!) 163/89  02/22/17 128/74    HYPERLIPIDEMIA: Vergie Living  presents for the medical management of hyperlipidemia.  Patient's current HLD regimen is: Atorvastatin (Started Nov 2024).  Patient is  currently taking prescribed medications for HLD.  Adhering to heathy diet: No Exercising regularly: No Denies myalgias.   The 10-year ASCVD risk score (Arnett DK, et al., 2019) is: 54.8%   Values used to calculate the score:     Age: 72 years     Sex: Male     Is Non-Hispanic African American: No     Diabetic: No     Tobacco smoker: Yes     Systolic Blood Pressure: 194 mmHg     Is BP treated: Yes     HDL Cholesterol: 44 mg/dL     Total Cholesterol: 257 mg/dL  Lab Results  Component Value Date   CHOL 257 (H) 01/30/2023   HDL 44 01/30/2023   LDLCALC 143 (H) 01/30/2023   TRIG 379 (H) 01/30/2023   CHOLHDL 5.8 (H) 01/30/2023      The following portions of the patient's history were reviewed and updated as appropriate: past medical history, past surgical history, family history, social history, allergies, medications, and problem list.   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, abuse (HCC) 01/30/2023   Episodic mood disorder (HCC) 01/30/2023   Bipolar I disorder (HCC) 01/30/2023   Asthma 01/30/2023   Antisocial personality disorder (HCC) 01/30/2023   Alcohol dependence,  continuous (HCC) 01/30/2023   Acute hepatitis C 01/30/2023   Chronic schizoaffective schizophrenia (HCC) 01/30/2023   Alcohol dependence (HCC) 01/30/2023   Cognitive impairment 01/30/2023   Hx of traumatic brain injury 01/30/2023   Hernia of abdominal cavity 10/30/2016   MDD (major depressive disorder), recurrent severe, 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    Adjustment disorder with disturbance of emotion 04/02/2015   Hyponatremia 03/13/2015   PTSD (post-traumatic stress disorder) 12/03/2014   Hypertension 12/03/2014   Cocaine abuse (HCC) 12/03/2014   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   Pure hypertriglyceridemia 03/28/1999   Past Medical History:  Diagnosis Date   Allergic rhinitis 01/30/2023   Bipolar 1 disorder (HCC)    Brain injury (HCC)    Cocaine dependence, continuous (HCC) 01/30/2023   Dermatophytosis of nail 01/30/2023   GERD (gastroesophageal reflux disease)    Homeless 01/30/2023   Hypertension    Ingrowing nail 01/30/2023   Major depressive disorder, recurrent episode, in full remission (HCC) 01/30/2023   Migraine 01/30/2023   PTSD (post-traumatic stress disorder)    PTSD (post-traumatic stress disorder)    Past Surgical History:  Procedure Laterality Date   COLONOSCOPY WITH PROPOFOL N/A 01/10/2017   Procedure: COLONOSCOPY WITH PROPOFOL;  Surgeon: Kieth Brightly, MD;  Location: [facility];  Service: Endoscopy;  Laterality: N/A;   HERNIA REPAIR  2010   left groin   INGUINAL HERNIA REPAIR Right 01/16/2017   Procedure: HERNIA REPAIR INGUINAL ADULT WITH PARTIAL OMENTECTOMY;  Surgeon: Kieth Brightly, MD;  Location: [facility] ORS;  Service: General;  Laterality: Right;   none     Family History  Problem Relation Age of Onset   Hypertension Other    Outpatient Medications Prior to Visit  Medication Sig Dispense Refill   aspirin EC 81 MG tablet Take 81 mg by mouth daily.     atorvastatin (LIPITOR) 40 MG tablet Take 1 tablet (40 mg total) by mouth at bedtime. 90 tablet 3   hydroxypropyl methylcellulose / hypromellose (ISOPTO TEARS / GONIOVISC) 2.5 % ophthalmic solution Place 1 drop into both eyes 2 (two) times daily.     loratadine (CLARITIN) 10 MG tablet Take 10 mg by mouth daily as needed for allergies.     Multiple Vitamins-Minerals (MULTIVITAMIN WITH MINERALS) tablet Take 1 tablet  by mouth daily.     omeprazole (PRILOSEC) 20 MG capsule Take 20 mg by mouth daily.     sennosides-docusate sodium (SENOKOT-S) 8.6-50 MG tablet Take 2 tablets by mouth daily. To prevent constipation     acetaminophen (TYLENOL) 325 MG tablet Take 650 mg by mouth 3 (three) times daily as needed for mild pain, moderate pain or fever.     amLODipine (NORVASC) 10 MG tablet Take 10 mg by mouth daily.     Calcium Carbonate-Vitamin D (CALCIUM 600+D) 600-400 MG-UNIT tablet Take 2 tablets by mouth daily.     diclofenac (VOLTAREN) 50 MG EC tablet Take 100 mg by mouth 2 (two) times daily as needed for mild pain or moderate pain.     hydrOXYzine (ATARAX/VISTARIL) 25 MG tablet Take 25 mg by mouth every evening.     lisinopril-hydrochlorothiazide (PRINZIDE,ZESTORETIC) 20-25 MG tablet Take 2 tablets by mouth daily.     Melatonin 3 MG TABS Take 6 mg by mouth every evening. Between 1800-2000     propranolol ER (INDERAL LA) 80 MG 24 hr capsule Take 160 mg by mouth daily.      QUEtiapine (SEROQUEL) 100 MG tablet Take 100 mg by mouth at  bedtime.     No facility-administered medications prior to visit.   Allergies  Allergen Reactions   Ketorolac Rash     ROS: A complete ROS was performed with pertinent positives/negatives noted in the HPI. The remainder of the ROS are negative.    Objective:   Today's Vitals   02/28/23 1008 02/28/23 1122  BP: (!) 197/113 (!) 194/92  Pulse: 72   SpO2: 100%   Weight: 148 lb 14.4 oz (67.5 kg)   Height: 5\' 5"  (1.651 m)     Physical Exam          GENERAL: Well-appearing, in NAD. Well nourished.  SKIN: Pink, warm and dry. No rash, lesion, ulceration, or ecchymoses.  Head: Normocephalic. NECK: Trachea midline. Full ROM w/o pain or tenderness.   RESPIRATORY: Chest wall symmetrical. Respirations even and non-labored. Breath sounds clear to auscultation bilaterally.  CARDIAC: S1, S2 present, regular rate and rhythm without murmur or gallops. Peripheral pulses 2+  bilaterally.  MSK: Muscle tone and strength appropriate for age.  EXTREMITIES: Without clubbing, cyanosis, or edema.  NEUROLOGIC: No motor or sensory deficits. Steady, even gait. C2-C12 intact.  PSYCH/MENTAL STATUS: Alert, oriented x person, place, situation. Pleasant, cooperative, appropriate mood and affect. Judgment impaired and long term recall impaired.   MMSE: 20     Assessment & Plan:  1. Cognitive impairment Stable at this time.  Recommend continuing Seroquel as cognitive impairment is post TBI and this seems to be working well for patient.  [facility] in [facility] with P[facility] as well.  Discussed memory improvement with stimulation activities, improve nutrition and exercise.  2. Primary hypertension Uncontrolled.  PCP concerned regarding compliance to medication therapy.  [facility] in the next 2 weeks.  We did give him 1 tablet of clonidine and had him rest for 30 minutes in the office with only slight improvement of blood pressure.  He is asymptomatic at this time.  I have refilled all medications including amlodipine, lisinopril HCTZ, propranolol.  I have also added on hydralazine 10 mg twice daily as needed if blood pressures over 150/90.  Family will pick up blood pressure cuff today and check when they get home and give him 1 tablet of hydralazine.  If BP does not improve with hydralazine, they will call PCP office and patient will be sent to ER for hypertensive urgency. - cloNIDine (CATAPRES) tablet 0.1 mg  3. Mixed hyperlipidemia Patient tolerating atorvastatin well.  Will check lipid panel and LFTs today and titrate medication as needed.  We discussed healthy diet with decrease in sodium, fats, and improvement in omega-3's and lean proteins. - Comprehensive metabolic panel - Lipid panel   Meds ordered this encounter  Medications   amLODipine (NORVASC) 10 MG tablet    Sig: Take 1 tablet (10 mg total) by mouth daily.    Dispense:   90 tablet    Refill:  3    Order Specific Question:   Supervising Provider    Answer:   DE [place], RAYMOND J [[id number]]   lisinopril-hydrochlorothiazide (ZESTORETIC) 20-25 MG tablet    Sig: Take 2 tablets by mouth daily.    Dispense:  180 tablet    Refill:  3    Order Specific Question:   Supervising Provider    Answer:   DE [place], RAYMOND J [id number]   propranolol ER (INDERAL LA) 80 MG 24 hr capsule    Sig: Take 2 capsules (160 mg total) by mouth daily.  Dispense:  180 capsule    Refill:  3    Order Specific Question:   Supervising Provider    Answer:   DE [place], RAYMOND J [[id number]]   QUEtiapine (SEROQUEL) 100 MG tablet    Sig: Take 1 tablet (100 mg total) by mouth at bedtime.    Dispense:  90 tablet    Refill:  3    Order Specific Question:   Supervising Provider    Answer:   DE [place], RAYMOND J [id number]   cloNIDine (CATAPRES) tablet 0.1 mg   hydrALAZINE (APRESOLINE) 10 MG tablet    Sig: Take 1 tablet (10 mg total) by mouth 2 (two) times daily as needed (if blood pressure reading is over 150/90).    Dispense:  60 tablet    Refill:  3    Order Specific Question:   Supervising Provider    Answer:   DE [place], RAYMOND J [id number]   Lab Orders         Comprehensive metabolic panel         Lipid panel      Return for 1 week BP Check w/ Nurse; 4 month f/u w/ PCP for HTN, HLD .    Patient to reach out to office if new, worrisome, or unresolved symptoms arise or if no improvement in patient's condition. Patient verbalized understanding and is agreeable to treatment plan. All questions answered to patient's satisfaction.    Hilbert Bible, [place]

## 2023-03-01 LAB — COMPREHENSIVE METABOLIC PANEL
ALT: 26 [IU]/L (ref 0–44)
AST: 28 [IU]/L (ref 0–40)
Albumin: 4.2 g/dL (ref 3.8–4.8)
Alkaline Phosphatase: 67 [IU]/L (ref 44–121)
BUN/Creatinine Ratio: 13 (ref 10–24)
BUN: 11 mg/dL (ref 8–27)
Bilirubin Total: 0.4 mg/dL (ref 0.0–1.2)
CO2: 24 mmol/L (ref 20–29)
Calcium: 9.4 mg/dL (ref 8.6–10.2)
Chloride: 102 mmol/L (ref 96–106)
Creatinine, Ser: 0.86 mg/dL (ref 0.76–1.27)
Globulin, Total: 2.4 g/dL (ref 1.5–4.5)
Glucose: 91 mg/dL (ref 70–99)
Potassium: 4 mmol/L (ref 3.5–5.2)
Sodium: 138 mmol/L (ref 134–144)
Total Protein: 6.6 g/dL (ref 6.0–8.5)
eGFR: 92 mL/min/{1.73_m2} (ref >59)

## 2023-03-01 LAB — LIPID PANEL
Chol/HDL Ratio: 4 {ratio} (ref 0.0–5.0)
Cholesterol, Total: 170 mg/dL (ref 100–199)
HDL: 42 mg/dL (ref >39)
LDL Chol Calc (NIH): 89 mg/dL (ref 0–99)
Triglycerides: 232 mg/dL — ABNORMAL HIGH (ref 0–149)
VLDL Cholesterol Cal: 39 mg/dL (ref 5–40)

## 2023-03-01 NOTE — Progress Notes (Signed)
Hi Carl Ortega, Your cholesterol has significantly improved with the medication.  Your triglycerides are still slightly elevated.  I would like you to start an omega-3 fish oil 1000 mg/day to help this level come down.  Also avoiding sugar, decreasing carbohydrates such as breads, pastas, chips will also help to bring this level down.  We will continue on your current dosage of atorvastatin.  Your electrolytes, kidney and liver function are normal.  We will repeat these levels at your appointment in April.  If you have any questions please let me know

## 2023-03-06 ENCOUNTER — Ambulatory Visit (INDEPENDENT_AMBULATORY_CARE_PROVIDER_SITE_OTHER): Payer: Medicare Other | Admitting: *Deleted

## 2023-03-06 ENCOUNTER — Ambulatory Visit (HOSPITAL_BASED_OUTPATIENT_CLINIC_OR_DEPARTMENT_OTHER): Payer: Medicare Other

## 2023-03-06 ENCOUNTER — Encounter (HOSPITAL_BASED_OUTPATIENT_CLINIC_OR_DEPARTMENT_OTHER): Payer: Self-pay | Admitting: *Deleted

## 2023-03-06 ENCOUNTER — Other Ambulatory Visit (HOSPITAL_BASED_OUTPATIENT_CLINIC_OR_DEPARTMENT_OTHER): Payer: Self-pay

## 2023-03-06 VITALS — BP 128/84 | HR 62 | Temp 98.2°F | Ht 65.0 in | Wt 148.9 lb

## 2023-03-06 DIAGNOSIS — Z0000 Encounter for general adult medical examination without abnormal findings: Secondary | ICD-10-CM

## 2023-03-06 DIAGNOSIS — F17200 Nicotine dependence, unspecified, uncomplicated: Secondary | ICD-10-CM

## 2023-03-06 DIAGNOSIS — Z23 Encounter for immunization: Secondary | ICD-10-CM

## 2023-03-06 MED ORDER — ZOSTER VAC RECOMB ADJUVANTED 50 MCG/0.5ML IM SUSR
0.5000 mL | Freq: Once | INTRAMUSCULAR | 1 refills | Status: AC
  Filled 2023-03-06: qty 0.5, 1d supply, fill #0

## 2023-03-06 NOTE — Progress Notes (Signed)
Subjective:   Carl Ortega is a 72 y.o. male who presents for an Initial Medicare Annual Wellness Visit.  Visit Complete: In person  Patient Medicare AWV questionnaire was completed by the patient on 03/06/23; I have confirmed that all information answered by patient is correct and no changes since this date.  Cardiac Risk Factors include: advanced age (>78men, >29 women);hypertension;male gender;smoking/ tobacco exposure     Objective:    Today's Vitals   03/06/23 1120  BP: (!) 129/91  Pulse: 62  Temp: 98.2 F (36.8 C)  TempSrc: Oral  SpO2: 97%  Weight: 148 lb 14.4 oz (67.5 kg)  Height: 5\' 5"  (1.651 m)  PainSc: 0-No pain   Body mass index is 24.78 kg/m.     01/16/2017    6:59 AM 01/12/2017   11:49 AM 01/10/2017    9:47 AM 08/30/2016   10:26 AM 07/13/2015    7:57 AM 04/01/2015    3:30 PM 03/13/2015    1:35 PM  Advanced Directives  Does Patient Have a Medical Advance Directive? No No No No No No No  Would patient like information on creating a medical advance directive? No - Patient declined Yes (MAU/Ambulatory/Procedural Areas - Information given)   No - patient declined information No - patient declined information No - patient declined information    Current Medications (verified) Outpatient Encounter Medications as of 03/06/2023  Medication Sig   amLODipine (NORVASC) 10 MG tablet Take 1 tablet (10 mg total) by mouth daily.   aspirin EC 81 MG tablet Take 81 mg by mouth daily.   atorvastatin (LIPITOR) 40 MG tablet Take 1 tablet (40 mg total) by mouth at bedtime.   hydrALAZINE (APRESOLINE) 10 MG tablet Take 1 tablet (10 mg total) by mouth 2 (two) times daily as needed (if blood pressure reading is over 150/90).   hydroxypropyl methylcellulose / hypromellose (ISOPTO TEARS / GONIOVISC) 2.5 % ophthalmic solution Place 1 drop into both eyes 2 (two) times daily.   lisinopril-hydrochlorothiazide (ZESTORETIC) 20-25 MG tablet Take 2 tablets by mouth daily.   loratadine  (CLARITIN) 10 MG tablet Take 10 mg by mouth daily as needed for allergies.   Multiple Vitamins-Minerals (MULTIVITAMIN WITH MINERALS) tablet Take 1 tablet by mouth daily.   omeprazole (PRILOSEC) 20 MG capsule Take 20 mg by mouth daily.   propranolol ER (INDERAL LA) 80 MG 24 hr capsule Take 2 capsules (160 mg total) by mouth daily.   QUEtiapine (SEROQUEL) 100 MG tablet Take 1 tablet (100 mg total) by mouth at bedtime.   sennosides-docusate sodium (SENOKOT-S) 8.6-50 MG tablet Take 2 tablets by mouth daily. To prevent constipation   Facility-Administered Encounter Medications as of 03/06/2023  Medication   cloNIDine (CATAPRES) tablet 0.1 mg    Allergies (verified) Ketorolac   History: Past Medical History:  Diagnosis Date   Allergic rhinitis 01/30/2023   Bipolar 1 disorder (HCC)    Brain injury (HCC)    Cocaine dependence, continuous (HCC) 01/30/2023   Dermatophytosis of nail 01/30/2023   GERD (gastroesophageal reflux disease)    Homeless 01/30/2023   Hypertension    Ingrowing nail 01/30/2023   Major depressive disorder, recurrent episode, in full remission (HCC) 01/30/2023   Migraine 01/30/2023   PTSD (post-traumatic stress disorder)    PTSD (post-traumatic stress disorder)    Past Surgical History:  Procedure Laterality Date   COLONOSCOPY WITH PROPOFOL N/A 01/10/2017   Procedure: COLONOSCOPY WITH PROPOFOL;  Surgeon: Kieth Brightly, MD;  Location: [facility] ENDOSCOPY;  Service: Endoscopy;  Laterality: N/A;   HERNIA REPAIR  2010   left groin   INGUINAL HERNIA REPAIR Right 01/16/2017   Procedure: HERNIA REPAIR INGUINAL ADULT WITH PARTIAL OMENTECTOMY;  Surgeon: Kieth Brightly, MD;  Location: [facility] ORS;  Service: General;  Laterality: Right;   none     Family History  Problem Relation Age of Onset   Hypertension Other    Social History   Socioeconomic History   Marital status: Married    Spouse name: Not on file   Number of children: Not on file   Years of  education: Not on file   Highest education level: Some college, no degree  Occupational History   Not on file  Tobacco Use   Smoking status: Every Day    Current packs/day: 0.25    Types: Cigarettes   Smokeless tobacco: Former  Building services engineer status: Never Used  Substance and Sexual Activity   Alcohol use: Yes    Alcohol/week: 4.0 standard drinks of alcohol    Types: 4 Standard drinks or equivalent per week    Comment: denies regular use   Drug use: Yes    Types: Cocaine    Comment: patient states 7-8 years since last use   Sexual activity: Not on file  Other Topics Concern   Not on file  Social History Narrative   Not on file   Social Determinants of Health   Financial Resource Strain: Low Risk  (02/25/2023)   Overall Financial Resource Strain (CARDIA)    Difficulty of Paying Living Expenses: Not hard at all  Food Insecurity: No Food Insecurity (02/25/2023)   Hunger Vital Sign    Worried About Running Out of Food in the Last Year: Never true    Ran Out of Food in the Last Year: Never true  Transportation Needs: No Transportation Needs (02/25/2023)   PRAPARE - Administrator, Civil Service (Medical): No    Lack of Transportation (Non-Medical): No  Physical Activity: Insufficiently Active (02/25/2023)   Exercise Vital Sign    Days of Exercise per Week: 2 days    Minutes of Exercise per Session: 10 min  Stress: No Stress Concern Present (02/25/2023)   [employer] of Occupational Health - Occupational Stress Questionnaire    Feeling of Stress : Not at all  Social Connections: Socially Isolated (02/25/2023)   Social Connection and Isolation Panel [NHANES]    Frequency of Communication with Friends and Family: Once a week    Frequency of Social Gatherings with Friends and Family: Once a week    Attends Religious Services: Never    Database administrator or Organizations: No    Attends Engineer, structural: Never    Marital Status: Divorced     Tobacco Counseling Ready to quit: Not Answered Counseling given: Not Answered   Clinical Intake:  Pre-visit preparation completed: Yes  Pain : No/denies pain Pain Score: 0-No pain     BMI - recorded: 24.78 Nutritional Status: BMI of 19-24  Normal Nutritional Risks: None Diabetes: No  How often do you need to have someone help you when you read instructions, pamphlets, or other written materials from your doctor or pharmacy?: 3 - Sometimes What is the last grade level you completed in school?: 12th grade  Interpreter Needed?: No  Comments: Patient is in the room with guardians who are also helping to get questions answered if needed Information entered by :: Cristy Hilts, CMA   Activities of Daily Living  03/06/2023   11:23 AM  In your present state of health, do you have any difficulty performing the following activities:  Hearing? 1  Vision? 0  Difficulty concentrating or making decisions? 1  Walking or climbing stairs? 0  Dressing or bathing? 0  Doing errands, shopping? 1  Preparing Food and eating ? N  Using the Toilet? N  In the past six months, have you accidently leaked urine? N  Do you have problems with loss of bowel control? N  Managing your Medications? Y  Managing your Finances? Y  Housekeeping or managing your Housekeeping? N    Patient Care Team: Caudle, Shelton Silvas, FNP as PCP - General (Family Medicine) Kieth Brightly, MD (General Surgery)  Indicate any recent Medical Services you may have received from other than [facility] providers in the past year (date may be approximate).     Assessment:   This is a routine wellness examination for Aidan.  Hearing/Vision screen No results found.   Goals Addressed   None   Depression Screen    03/06/2023   11:27 AM 02/28/2023   10:12 AM 01/30/2023   10:53 AM 10/30/2016    3:28 PM  PHQ 2/9 Scores  PHQ - 2 Score 0 0 0 0  PHQ- 9 Score 0 0 0     Fall Risk    03/06/2023   11:26 AM  02/28/2023   10:12 AM 01/30/2023   10:53 AM 10/30/2016    3:28 PM  Fall Risk   Falls in the past year? 0 0 0 No  Number falls in past yr: 0 0 0   Injury with Fall? 0 0 0   Risk for fall due to : No Fall Risks No Fall Risks No Fall Risks   Follow up Falls evaluation completed Falls evaluation completed Falls evaluation completed     MEDICARE RISK AT HOME: Medicare Risk at Home Any stairs in or around the home?: Yes If so, are there any without handrails?: No Home free of loose throw rugs in walkways, pet beds, electrical cords, etc?: No Adequate lighting in your home to reduce risk of falls?: Yes Life alert?: No Use of a cane, walker or w/c?: No Grab bars in the bathroom?: No Shower chair or bench in shower?: No Elevated toilet seat or a handicapped toilet?: Yes   Cognitive Function:        03/06/2023   11:27 AM  6CIT Screen  What Year? 4 points  What month? 3 points  What time? 3 points  Count back from 20 0 points  Months in reverse 0 points  Repeat phrase 4 points  Total Score 14 points    Immunizations Immunization History  Administered Date(s) Administered   Fluad Trivalent(High Dose 65+) 01/30/2023   Influenza, Seasonal, Injecte, Preservative Fre 01/05/2009, 12/01/2010, 01/01/2012, 05/19/2015   Influenza,inj,Quad PF,6+ Mos 03/15/2015   Influenza-Unspecified 01/21/1997, 01/19/1998, 01/26/2000, 01/24/2001, 01/22/2002, 01/19/2003, 04/16/2004, 04/25/2005, 01/04/2006, 01/25/2006, 11/26/2006, 01/02/2007, 12/16/2007, 02/11/2010, 01/25/2013, 11/25/2013, 02/25/2016, 04/11/2017   PFIZER(Purple Top)SARS-COV-2 Vaccination 06/17/2019, 07/08/2019, 01/06/2020   Pneumococcal Conjugate-13 05/29/2016   Pneumococcal Polysaccharide-23 05/03/2006, 03/15/2015, 11/15/2020   Pneumococcal-Unspecified 07/07/1999, 12/15/2003, 05/03/2006   Tdap 05/07/2012   Tetanus 08/26/2002   Zoster Recombinant(Shingrix) 11/15/2020    TDAP status: Up to date  Flu Vaccine status: Up to  date  Pneumococcal vaccine status: Up to date  Covid-19 vaccine status: Declined, Education has been provided regarding the importance of this vaccine but patient still declined. Advised may receive this vaccine at local  pharmacy or Health Dept.or vaccine clinic. Aware to provide a copy of the vaccination record if obtained from local pharmacy or Health Dept. Verbalized acceptance and understanding.  Qualifies for Shingles Vaccine? Yes    Shingrix Completed?: No.    Education has been provided regarding the importance of this vaccine. Patient has been advised to call insurance company to determine out of pocket expense if they have not yet received this vaccine. Advised may also receive vaccine at local pharmacy or Health Dept. Verbalized acceptance and understanding.  Screening Tests Health Maintenance  Topic Date Due   Zoster Vaccines- Shingrix (2 of 2) 01/10/2021   COVID-19 Vaccine (4 - 2023-24 season) 03/16/2023 (Originally 11/26/2022)   DTaP/Tdap/Td (2 - Td or Tdap) 02/28/2024 (Originally 05/07/2022)   Medicare Annual Wellness (AWV)  03/05/2024   Colonoscopy  01/11/2027   Pneumonia Vaccine 30+ Years old  Completed   INFLUENZA VACCINE  Completed   Hepatitis C Screening  Completed   HPV VACCINES  Aged Out    Health Maintenance  Health Maintenance Due  Topic Date Due   Zoster Vaccines- Shingrix (2 of 2) 01/10/2021    Colorectal cancer screening: Type of screening: Colonoscopy. Completed 2018. Repeat every 10 years  Lung Cancer Screening: (Low Dose CT Chest recommended if Age 26-80 years, 20 pack-year currently smoking OR have quit w/in 15years.) does qualify.   Lung Cancer Screening Referral: referrral placed  Additional Screening:  Hepatitis C Screening: does qualify; Completed 01/30/23  Vision Screening: Recommended annual ophthalmology exams for early detection of glaucoma and other disorders of the eye. Is the patient up to date with their annual eye exam?  No  Who is the  provider or what is the name of the office in which the patient attends annual eye exams? Usually goes to the [place] but will go to different eye doctor If pt is not established with a provider, would they like to be referred to a provider to establish care? No .   Dental Screening: Recommended annual dental exams for proper oral hygiene    Community Resource Referral / Chronic Care Management: CRR required this visit?  No   CCM required this visit?  No    Plan:     I have personally reviewed and noted the following in the patient's chart:   Medical and social history Use of alcohol, tobacco or illicit drugs  Current medications and supplements including opioid prescriptions. Patient is currently taking opioid prescriptions. Information provided to patient regarding non-opioid alternatives. Patient advised to discuss non-opioid treatment plan with their provider. Functional ability and status Nutritional status Physical activity Advanced directives List of other physicians Hospitalizations, surgeries, and ER visits in previous 12 months Vitals Screenings to include cognitive, depression, and falls Referrals and appointments  In addition, I have reviewed and discussed with patient certain preventive protocols, quality metrics, and best practice recommendations. A written personalized care plan for preventive services as well as general preventive health recommendations were provided to patient.     Warner Laduca, Farley Ly, CMA   03/06/2023   After Visit Summary: (In Person-Declined) Patient declined AVS at this time.

## 2023-03-07 ENCOUNTER — Ambulatory Visit (HOSPITAL_BASED_OUTPATIENT_CLINIC_OR_DEPARTMENT_OTHER): Payer: Medicare Other

## 2023-03-09 NOTE — Patient Instructions (Signed)
Carl Ortega , Thank you for taking time to come for your Medicare Wellness Visit. I appreciate your ongoing commitment to your health goals. Please review the following plan we discussed and let me know if I can assist you in the future.   Referrals/Orders/Follow-Ups/Clinician Recommendations: done  This is a list of the screening recommended for you and due dates:  Health Maintenance  Topic Date Due   COVID-19 Vaccine (4 - 2024-25 season) 03/16/2023*   DTaP/Tdap/Td vaccine (2 - Td or Tdap) 02/28/2024*   Medicare Annual Wellness Visit  03/05/2024   Colon Cancer Screening  01/11/2027   Pneumonia Vaccine  Completed   Flu Shot  Completed   Hepatitis C Screening  Completed   Zoster (Shingles) Vaccine  Completed   HPV Vaccine  Aged Out  *Topic was postponed. The date shown is not the original due date.      Next Medicare Annual Wellness Visit scheduled for next year: No

## 2023-03-13 ENCOUNTER — Telehealth (HOSPITAL_BASED_OUTPATIENT_CLINIC_OR_DEPARTMENT_OTHER): Payer: Self-pay | Admitting: Family Medicine

## 2023-03-13 NOTE — Telephone Encounter (Signed)
Copied from CRM [id number]. Topic: General - Phone/Fax/Address >> Mar 13, 2023 12:25 PM Alcus Dad H wrote: Patient misplaced papers and is wanting paperwork(comprehensive for white mass on brain?) faxed to the [place] for him at [phone]. Pt wants a cb at mobile number on file when the fax has been sent to confirm please

## 2023-03-14 NOTE — Telephone Encounter (Signed)
Mychart message sent to Cael/Timothy about the report.

## 2023-03-16 NOTE — Telephone Encounter (Signed)
Pt and guardian came by to sign a release of records form. [facility] at provided [facility] number.

## 2023-03-23 NOTE — Telephone Encounter (Signed)
Copied from CRM [id number]. Topic: General - Other >> Mar 23, 2023 11:04 AM Donita Brooks wrote: Reason for CRM: pt is calling in to see if Dr. Jon Gills got his paperwork sign - [phone]      Pt's paperwork had been attempted multiple times to be [facility] to [facility] at the fax number that was provided by pt's guardian but each time it was faxed, we received a communication error.   Betsy, please advise if you know if pt's paperwork (OV and CT results) had ever been able to go through to the [place] as this was last attempted to be faxed by me at the fax machine up front.

## 2023-03-26 ENCOUNTER — Other Ambulatory Visit: Payer: Self-pay

## 2023-03-26 ENCOUNTER — Emergency Department: Payer: Medicare Other

## 2023-03-26 DIAGNOSIS — F319 Bipolar disorder, unspecified: Secondary | ICD-10-CM | POA: Diagnosis present

## 2023-03-26 DIAGNOSIS — Z7982 Long term (current) use of aspirin: Secondary | ICD-10-CM

## 2023-03-26 DIAGNOSIS — E785 Hyperlipidemia, unspecified: Secondary | ICD-10-CM | POA: Diagnosis present

## 2023-03-26 DIAGNOSIS — J440 Chronic obstructive pulmonary disease with acute lower respiratory infection: Secondary | ICD-10-CM | POA: Diagnosis present

## 2023-03-26 DIAGNOSIS — L600 Ingrowing nail: Secondary | ICD-10-CM | POA: Diagnosis present

## 2023-03-26 DIAGNOSIS — F4310 Post-traumatic stress disorder, unspecified: Secondary | ICD-10-CM | POA: Diagnosis present

## 2023-03-26 DIAGNOSIS — G43909 Migraine, unspecified, not intractable, without status migrainosus: Secondary | ICD-10-CM | POA: Diagnosis present

## 2023-03-26 DIAGNOSIS — I10 Essential (primary) hypertension: Secondary | ICD-10-CM | POA: Diagnosis present

## 2023-03-26 DIAGNOSIS — Z1152 Encounter for screening for COVID-19: Secondary | ICD-10-CM

## 2023-03-26 DIAGNOSIS — K219 Gastro-esophageal reflux disease without esophagitis: Secondary | ICD-10-CM | POA: Diagnosis present

## 2023-03-26 DIAGNOSIS — J159 Unspecified bacterial pneumonia: Secondary | ICD-10-CM | POA: Diagnosis present

## 2023-03-26 DIAGNOSIS — F1420 Cocaine dependence, uncomplicated: Secondary | ICD-10-CM | POA: Diagnosis present

## 2023-03-26 DIAGNOSIS — F17210 Nicotine dependence, cigarettes, uncomplicated: Secondary | ICD-10-CM | POA: Diagnosis present

## 2023-03-26 DIAGNOSIS — E876 Hypokalemia: Secondary | ICD-10-CM | POA: Diagnosis present

## 2023-03-26 DIAGNOSIS — Z79899 Other long term (current) drug therapy: Secondary | ICD-10-CM

## 2023-03-26 DIAGNOSIS — Z8249 Family history of ischemic heart disease and other diseases of the circulatory system: Secondary | ICD-10-CM

## 2023-03-26 DIAGNOSIS — Z888 Allergy status to other drugs, medicaments and biological substances status: Secondary | ICD-10-CM

## 2023-03-26 DIAGNOSIS — E871 Hypo-osmolality and hyponatremia: Secondary | ICD-10-CM | POA: Diagnosis not present

## 2023-03-26 DIAGNOSIS — T502X5A Adverse effect of carbonic-anhydrase inhibitors, benzothiadiazides and other diuretics, initial encounter: Secondary | ICD-10-CM | POA: Diagnosis present

## 2023-03-26 DIAGNOSIS — J441 Chronic obstructive pulmonary disease with (acute) exacerbation: Secondary | ICD-10-CM | POA: Diagnosis present

## 2023-03-26 DIAGNOSIS — Z5900 Homelessness unspecified: Secondary | ICD-10-CM

## 2023-03-26 LAB — COMPREHENSIVE METABOLIC PANEL
ALT: 28 U/L (ref 0–44)
AST: 34 U/L (ref 15–41)
Albumin: 3.6 g/dL (ref 3.5–5.0)
Alkaline Phosphatase: 105 U/L (ref 38–126)
Anion gap: 9 (ref 5–15)
BUN: 15 mg/dL (ref 8–23)
CO2: 25 mmol/L (ref 22–32)
Calcium: 9 mg/dL (ref 8.9–10.3)
Chloride: 86 mmol/L — ABNORMAL LOW (ref 98–111)
Creatinine, Ser: 1.21 mg/dL (ref 0.61–1.24)
GFR, Estimated: >60 mL/min (ref >60)
Glucose, Bld: 118 mg/dL — ABNORMAL HIGH (ref 70–99)
Potassium: 2.8 mmol/L — ABNORMAL LOW (ref 3.5–5.1)
Sodium: 120 mmol/L — ABNORMAL LOW (ref 135–145)
Total Bilirubin: 0.9 mg/dL (ref 0.0–1.2)
Total Protein: 7.2 g/dL (ref 6.5–8.1)

## 2023-03-26 LAB — RESP PANEL BY RT-PCR (RSV, FLU A&B, COVID)  RVPGX2
Influenza A by PCR: NEGATIVE
Influenza B by PCR: NEGATIVE
Resp Syncytial Virus by PCR: NEGATIVE
SARS Coronavirus 2 by RT PCR: NEGATIVE

## 2023-03-26 LAB — CBC WITH DIFFERENTIAL/PLATELET
Abs Immature Granulocytes: 0.05 10*3/uL (ref 0.00–0.07)
Basophils Absolute: 0 10*3/uL (ref 0.0–0.1)
Basophils Relative: 0 %
Eosinophils Absolute: 0 10*3/uL (ref 0.0–0.5)
Eosinophils Relative: 0 %
HCT: 37.4 % — ABNORMAL LOW (ref 39.0–52.0)
Hemoglobin: 13.7 g/dL (ref 13.0–17.0)
Immature Granulocytes: 1 %
Lymphocytes Relative: 14 %
Lymphs Abs: 1.4 10*3/uL (ref 0.7–4.0)
MCH: 32.2 pg (ref 26.0–34.0)
MCHC: 36.6 g/dL — ABNORMAL HIGH (ref 30.0–36.0)
MCV: 87.8 fL (ref 80.0–100.0)
Monocytes Absolute: 1.4 10*3/uL — ABNORMAL HIGH (ref 0.1–1.0)
Monocytes Relative: 14 %
Neutro Abs: 7.1 10*3/uL (ref 1.7–7.7)
Neutrophils Relative %: 71 %
Platelets: 316 10*3/uL (ref 150–400)
RBC: 4.26 MIL/uL (ref 4.22–5.81)
RDW: 11.6 % (ref 11.5–15.5)
WBC: 10 10*3/uL (ref 4.0–10.5)
nRBC: 0 % (ref 0.0–0.2)

## 2023-03-26 LAB — TROPONIN I (HIGH SENSITIVITY): Troponin I (High Sensitivity): 33 ng/L — ABNORMAL HIGH (ref <18)

## 2023-03-26 NOTE — ED Triage Notes (Signed)
Pt sent from urgent care after presenting for cough, SOB, and chest pain. The doctor at urgent care said that he has pneumonia in his R lung and referred them to the ER. Pt has had sx x1 week. Pt is a smoker.

## 2023-03-27 ENCOUNTER — Inpatient Hospital Stay
Admission: EM | Admit: 2023-03-27 | Discharge: 2023-03-28 | DRG: 640 | Disposition: A | Discharge status: Home / Self Care | Payer: Medicare Other | Attending: Internal Medicine | Admitting: Internal Medicine

## 2023-03-27 ENCOUNTER — Inpatient Hospital Stay: Payer: Medicare Other

## 2023-03-27 DIAGNOSIS — Z5900 Homelessness unspecified: Secondary | ICD-10-CM | POA: Diagnosis not present

## 2023-03-27 DIAGNOSIS — F4310 Post-traumatic stress disorder, unspecified: Secondary | ICD-10-CM | POA: Diagnosis present

## 2023-03-27 DIAGNOSIS — J440 Chronic obstructive pulmonary disease with acute lower respiratory infection: Secondary | ICD-10-CM | POA: Diagnosis present

## 2023-03-27 DIAGNOSIS — J449 Chronic obstructive pulmonary disease, unspecified: Secondary | ICD-10-CM

## 2023-03-27 DIAGNOSIS — E876 Hypokalemia: Secondary | ICD-10-CM | POA: Diagnosis present

## 2023-03-27 DIAGNOSIS — G43909 Migraine, unspecified, not intractable, without status migrainosus: Secondary | ICD-10-CM | POA: Diagnosis present

## 2023-03-27 DIAGNOSIS — E871 Hypo-osmolality and hyponatremia: Secondary | ICD-10-CM | POA: Diagnosis present

## 2023-03-27 DIAGNOSIS — E785 Hyperlipidemia, unspecified: Secondary | ICD-10-CM | POA: Diagnosis present

## 2023-03-27 DIAGNOSIS — J441 Chronic obstructive pulmonary disease with (acute) exacerbation: Secondary | ICD-10-CM | POA: Diagnosis present

## 2023-03-27 DIAGNOSIS — L600 Ingrowing nail: Secondary | ICD-10-CM | POA: Diagnosis present

## 2023-03-27 DIAGNOSIS — F1420 Cocaine dependence, uncomplicated: Secondary | ICD-10-CM | POA: Diagnosis present

## 2023-03-27 DIAGNOSIS — I10 Essential (primary) hypertension: Secondary | ICD-10-CM | POA: Diagnosis present

## 2023-03-27 DIAGNOSIS — Z79899 Other long term (current) drug therapy: Secondary | ICD-10-CM | POA: Diagnosis not present

## 2023-03-27 DIAGNOSIS — Z7982 Long term (current) use of aspirin: Secondary | ICD-10-CM | POA: Diagnosis not present

## 2023-03-27 DIAGNOSIS — T502X5A Adverse effect of carbonic-anhydrase inhibitors, benzothiadiazides and other diuretics, initial encounter: Secondary | ICD-10-CM | POA: Diagnosis present

## 2023-03-27 DIAGNOSIS — F319 Bipolar disorder, unspecified: Secondary | ICD-10-CM | POA: Diagnosis present

## 2023-03-27 DIAGNOSIS — J189 Pneumonia, unspecified organism: Secondary | ICD-10-CM | POA: Diagnosis not present

## 2023-03-27 DIAGNOSIS — Z888 Allergy status to other drugs, medicaments and biological substances status: Secondary | ICD-10-CM | POA: Diagnosis not present

## 2023-03-27 DIAGNOSIS — J159 Unspecified bacterial pneumonia: Secondary | ICD-10-CM | POA: Diagnosis present

## 2023-03-27 DIAGNOSIS — Z8249 Family history of ischemic heart disease and other diseases of the circulatory system: Secondary | ICD-10-CM | POA: Diagnosis not present

## 2023-03-27 DIAGNOSIS — K219 Gastro-esophageal reflux disease without esophagitis: Secondary | ICD-10-CM | POA: Diagnosis present

## 2023-03-27 DIAGNOSIS — F17210 Nicotine dependence, cigarettes, uncomplicated: Secondary | ICD-10-CM | POA: Diagnosis present

## 2023-03-27 DIAGNOSIS — Z1152 Encounter for screening for COVID-19: Secondary | ICD-10-CM | POA: Diagnosis not present

## 2023-03-27 LAB — LACTIC ACID, PLASMA
Lactic Acid, Venous: 1.5 mmol/L (ref 0.5–1.9)
Lactic Acid, Venous: 2.5 mmol/L (ref 0.5–1.9)

## 2023-03-27 LAB — BASIC METABOLIC PANEL
Anion gap: 14 (ref 5–15)
BUN: 14 mg/dL (ref 8–23)
CO2: 20 mmol/L — ABNORMAL LOW (ref 22–32)
Calcium: 8.8 mg/dL — ABNORMAL LOW (ref 8.9–10.3)
Chloride: 95 mmol/L — ABNORMAL LOW (ref 98–111)
Creatinine, Ser: 1.1 mg/dL (ref 0.61–1.24)
GFR, Estimated: >60 mL/min (ref >60)
Glucose, Bld: 159 mg/dL — ABNORMAL HIGH (ref 70–99)
Potassium: 3.9 mmol/L (ref 3.5–5.1)
Sodium: 130 mmol/L — ABNORMAL LOW (ref 135–145)

## 2023-03-27 LAB — PHOSPHORUS: Phosphorus: 1.2 mg/dL — ABNORMAL LOW (ref 2.5–4.6)

## 2023-03-27 LAB — TSH: TSH: 0.799 u[IU]/mL (ref 0.350–4.500)

## 2023-03-27 LAB — NA AND K (SODIUM & POTASSIUM), RAND UR
Potassium Urine: 6 mmol/L
Sodium, Ur: 41 mmol/L

## 2023-03-27 LAB — OSMOLALITY, URINE: Osmolality, Ur: 208 mosm/kg — ABNORMAL LOW (ref 300–900)

## 2023-03-27 LAB — STREP PNEUMONIAE URINARY ANTIGEN: Strep Pneumo Urinary Antigen: NEGATIVE

## 2023-03-27 LAB — MAGNESIUM: Magnesium: 1.8 mg/dL (ref 1.7–2.4)

## 2023-03-27 LAB — OSMOLALITY: Osmolality: 277 mosm/kg (ref 275–295)

## 2023-03-27 MED ORDER — POTASSIUM & SODIUM PHOSPHATES 280-160-250 MG PO PACK
1.0000 | PACK | Freq: Three times a day (TID) | ORAL | Status: AC
  Administered 2023-03-27: 1 via ORAL
  Filled 2023-03-27 (×5): qty 1

## 2023-03-27 MED ORDER — PANTOPRAZOLE SODIUM 40 MG PO TBEC
40.0000 mg | DELAYED_RELEASE_TABLET | Freq: Every day | ORAL | Status: DC
  Administered 2023-03-27 – 2023-03-28 (×2): 40 mg via ORAL
  Filled 2023-03-27 (×2): qty 1

## 2023-03-27 MED ORDER — METHYLPREDNISOLONE SODIUM SUCC 40 MG IJ SOLR
40.0000 mg | Freq: Two times a day (BID) | INTRAMUSCULAR | Status: DC
  Administered 2023-03-27 – 2023-03-28 (×2): 40 mg via INTRAVENOUS
  Filled 2023-03-27 (×2): qty 1

## 2023-03-27 MED ORDER — METHYLPREDNISOLONE SODIUM SUCC 125 MG IJ SOLR
125.0000 mg | Freq: Once | INTRAMUSCULAR | Status: AC
  Administered 2023-03-27: 125 mg via INTRAVENOUS
  Filled 2023-03-27: qty 2

## 2023-03-27 MED ORDER — ENOXAPARIN SODIUM 40 MG/0.4ML IJ SOSY
40.0000 mg | PREFILLED_SYRINGE | INTRAMUSCULAR | Status: DC
  Administered 2023-03-27: 40 mg via SUBCUTANEOUS
  Filled 2023-03-27: qty 0.4

## 2023-03-27 MED ORDER — IPRATROPIUM-ALBUTEROL 0.5-2.5 (3) MG/3ML IN SOLN
9.0000 mL | Freq: Once | RESPIRATORY_TRACT | Status: AC
  Administered 2023-03-27: 9 mL via RESPIRATORY_TRACT
  Filled 2023-03-27: qty 3

## 2023-03-27 MED ORDER — POTASSIUM CHLORIDE CRYS ER 20 MEQ PO TBCR
40.0000 meq | EXTENDED_RELEASE_TABLET | Freq: Once | ORAL | Status: AC
  Administered 2023-03-27: 40 meq via ORAL
  Filled 2023-03-27: qty 2

## 2023-03-27 MED ORDER — MOMETASONE FURO-FORMOTEROL FUM 200-5 MCG/ACT IN AERO
2.0000 | INHALATION_SPRAY | Freq: Two times a day (BID) | RESPIRATORY_TRACT | Status: DC
  Administered 2023-03-27 – 2023-03-28 (×2): 2 via RESPIRATORY_TRACT
  Filled 2023-03-27 (×2): qty 8.8

## 2023-03-27 MED ORDER — ASPIRIN 81 MG PO TBEC
81.0000 mg | DELAYED_RELEASE_TABLET | Freq: Every day | ORAL | Status: DC
  Administered 2023-03-27 – 2023-03-28 (×2): 81 mg via ORAL
  Filled 2023-03-27 (×2): qty 1

## 2023-03-27 MED ORDER — ACETAMINOPHEN 325 MG PO TABS
650.0000 mg | ORAL_TABLET | Freq: Four times a day (QID) | ORAL | Status: DC | PRN
Start: 2023-03-27 — End: 2023-03-28

## 2023-03-27 MED ORDER — ONDANSETRON HCL 4 MG/2ML IJ SOLN: 4.0000 mg | Freq: Four times a day (QID) | INTRAMUSCULAR | Status: DC | PRN

## 2023-03-27 MED ORDER — SODIUM CHLORIDE 0.9 % IV BOLUS (SEPSIS): 1000.0000 mL | Freq: Once | INTRAVENOUS | Status: DC

## 2023-03-27 MED ORDER — SODIUM CHLORIDE 0.9 % IV SOLN
500.0000 mg | Freq: Once | INTRAVENOUS | Status: AC
  Administered 2023-03-27: 500 mg via INTRAVENOUS
  Filled 2023-03-27: qty 5

## 2023-03-27 MED ORDER — SODIUM CHLORIDE 0.9 % IV BOLUS (SEPSIS)
1000.0000 mL | Freq: Once | INTRAVENOUS | Status: AC
  Administered 2023-03-27: 1000 mL via INTRAVENOUS

## 2023-03-27 MED ORDER — AZITHROMYCIN 500 MG PO TABS
500.0000 mg | ORAL_TABLET | Freq: Every day | ORAL | Status: DC
  Administered 2023-03-28: 500 mg via ORAL
  Filled 2023-03-27: qty 1

## 2023-03-27 MED ORDER — PROPRANOLOL HCL ER 80 MG PO CP24
160.0000 mg | ORAL_CAPSULE | Freq: Every day | ORAL | Status: DC
  Administered 2023-03-28: 160 mg via ORAL
  Filled 2023-03-27 (×2): qty 2

## 2023-03-27 MED ORDER — LORATADINE 10 MG PO TABS: 10.0000 mg | ORAL_TABLET | Freq: Every day | ORAL | Status: DC | PRN

## 2023-03-27 MED ORDER — BUDESONIDE 0.25 MG/2ML IN SUSP
0.2500 mg | Freq: Two times a day (BID) | RESPIRATORY_TRACT | Status: DC
  Administered 2023-03-27 – 2023-03-28 (×3): 0.25 mg via RESPIRATORY_TRACT
  Filled 2023-03-27 (×3): qty 2

## 2023-03-27 MED ORDER — NICOTINE 14 MG/24HR TD PT24
14.0000 mg | MEDICATED_PATCH | Freq: Every day | TRANSDERMAL | Status: DC
  Administered 2023-03-27: 14 mg via TRANSDERMAL
  Filled 2023-03-27 (×2): qty 1

## 2023-03-27 MED ORDER — IPRATROPIUM-ALBUTEROL 0.5-2.5 (3) MG/3ML IN SOLN
3.0000 mL | Freq: Four times a day (QID) | RESPIRATORY_TRACT | Status: DC
  Administered 2023-03-27 – 2023-03-28 (×3): 3 mL via RESPIRATORY_TRACT
  Filled 2023-03-27 (×3): qty 3

## 2023-03-27 MED ORDER — ONDANSETRON HCL 4 MG PO TABS: 4.0000 mg | ORAL_TABLET | Freq: Four times a day (QID) | ORAL | Status: DC | PRN

## 2023-03-27 MED ORDER — QUETIAPINE FUMARATE 25 MG PO TABS
100.0000 mg | ORAL_TABLET | Freq: Every day | ORAL | Status: DC
  Administered 2023-03-27: 100 mg via ORAL
  Filled 2023-03-27: qty 4

## 2023-03-27 MED ORDER — ALBUTEROL SULFATE (2.5 MG/3ML) 0.083% IN NEBU: 2.5000 mg | INHALATION_SOLUTION | Freq: Four times a day (QID) | RESPIRATORY_TRACT | Status: DC | PRN

## 2023-03-27 MED ORDER — HYDRALAZINE HCL 20 MG/ML IJ SOLN: 5.0000 mg | Freq: Four times a day (QID) | INTRAMUSCULAR | Status: DC | PRN

## 2023-03-27 MED ORDER — ATORVASTATIN CALCIUM 20 MG PO TABS
40.0000 mg | ORAL_TABLET | Freq: Every day | ORAL | Status: DC
  Administered 2023-03-27 – 2023-03-28 (×2): 40 mg via ORAL
  Filled 2023-03-27 (×2): qty 2

## 2023-03-27 MED ORDER — SENNOSIDES-DOCUSATE SODIUM 8.6-50 MG PO TABS
2.0000 | ORAL_TABLET | Freq: Every day | ORAL | Status: DC
  Administered 2023-03-27: 2 via ORAL
  Filled 2023-03-27: qty 2

## 2023-03-27 MED ORDER — POLYVINYL ALCOHOL 1.4 % OP SOLN: 1.0000 [drp] | Freq: Two times a day (BID) | OPHTHALMIC | Status: DC | PRN

## 2023-03-27 MED ORDER — SODIUM CHLORIDE 0.9 % IV SOLN
1.0000 g | INTRAVENOUS | Status: DC
  Administered 2023-03-28: 1 g via INTRAVENOUS
  Filled 2023-03-27: qty 10

## 2023-03-27 MED ORDER — ACETAMINOPHEN 650 MG RE SUPP: 650.0000 mg | Freq: Four times a day (QID) | RECTAL | Status: DC | PRN

## 2023-03-27 MED ORDER — POTASSIUM CHLORIDE 10 MEQ/100ML IV SOLN
10.0000 meq | Freq: Once | INTRAVENOUS | Status: AC
  Administered 2023-03-27: 10 meq via INTRAVENOUS
  Filled 2023-03-27: qty 100

## 2023-03-27 MED ORDER — CEFTRIAXONE SODIUM 2 G IJ SOLR
2.0000 g | Freq: Once | INTRAMUSCULAR | Status: AC
  Administered 2023-03-27: 2 g via INTRAVENOUS
  Filled 2023-03-27: qty 20

## 2023-03-27 MED ORDER — DOCUSATE SODIUM 100 MG PO CAPS
100.0000 mg | ORAL_CAPSULE | Freq: Every day | ORAL | Status: DC
Start: 2023-03-28 — End: 2023-03-28
  Administered 2023-03-28: 100 mg via ORAL
  Filled 2023-03-27: qty 1

## 2023-03-27 NOTE — ED Notes (Signed)
Pt antsy moving around in room unplugging his monitors. RN placed pt back on monitor and educated pt about importance of monitor. Pt does have steady gait.

## 2023-03-27 NOTE — Progress Notes (Signed)
 Repeat BMP showed Na improved to 130. Phos=1.2, will order PO replacement and recheck Phos level tomorrow.

## 2023-03-27 NOTE — Progress Notes (Signed)
 CODE SEPSIS - PHARMACY COMMUNICATION  **Broad Spectrum Antibiotics should be administered within 1 hour of Sepsis diagnosis**  Time Code Sepsis Called/Page Received: 0900  Antibiotics Ordered: azithromycin , ceftriaxone   Time of 1st antibiotic administration: [phone]  Additional action taken by pharmacy: N/A  If necessary, Name of Provider/Nurse Contacted: N/A    Lum VEAR Mania ,PharmD Clinical Pharmacist  03/27/2023  12:32 PM

## 2023-03-27 NOTE — ED Notes (Signed)
Report given to [facility] nurse

## 2023-03-27 NOTE — ED Notes (Signed)
Bed alarm placed due to pt wandering. Pt informed to let this RN know if he needs something by hitting call light

## 2023-03-27 NOTE — ED Provider Notes (Signed)
 [facility] Provider Note    Event Date/Time   First MD Initiated Contact with Patient 03/27/23 [phone]     (approximate)   History   Cough and Pneumonia   HPI  Carl Ortega is a 72 y.o. male past medical history significant for hypertension, tobacco use, presents to the emergency department with cough and concern for pneumonia.  History is provided by the patient's friend who is at bedside.  States that he has been evaluated recently at primary care physician for concern for possible pneumonia.  Recent cough and congestion and has not been feeling well for the past 1 week.  States that as an outpatient they are trying to work him up for altered mental status but has acutely worsened over the past 1 week.  Patient states that he denies any nausea, vomiting or diarrhea.  Normal p.o. intake and normal urine output.  Does endorse tobacco use.  Denies any history of COPD.  Denies any history of DVT or PE.  Denies falls or trauma.     Physical Exam   Triage Vital Signs: ED Triage Vitals  Encounter Vitals Group     BP 03/26/23 1833 (!) 99/57     Systolic BP Percentile --      Diastolic BP Percentile --      Pulse Rate 03/26/23 1830 64     Resp 03/26/23 1830 20     Temp 03/26/23 1830 97.9 F (36.6 C)     Temp Source 03/26/23 1830 Oral     SpO2 03/26/23 1830 90 %     Weight --      Height --      Head Circumference --      Peak Flow --      Pain Score 03/26/23 1829 0     Pain Loc --      Pain Education --      Exclude from Growth Chart --     Most recent vital signs: Vitals:   03/27/23 0600 03/27/23 0830  BP: 118/60 132/76  Pulse: 60 62  Resp: 18 17  Temp: 97.9 F (36.6 C) 98.7 F (37.1 C)  SpO2: 98% 96%    Physical Exam  IMPRESSION / MDM / ASSESSMENT AND PLAN / ED COURSE  I reviewed the triage vital signs and the nursing notes.  Differential diagnosis including viral illness including COVID/influenza, pneumonia, electrolyte abnormality,  dehydration  Sodium resulted significantly low at 120.  Patient appears euvolemic.  Possible SIADH versus hyponatremia secondary to volume depletion in the setting of lower lobe pneumonia.  EKG  I, Clotilda Punter, the attending physician, personally viewed and interpreted this ECG.   Rate: Normal  Rhythm: Normal sinus  Axis: Normal  Intervals: Normal  ST&T Change: None    RADIOLOGY I independently reviewed imaging, my interpretation of imaging: Chest x-ray with findings concerning for left lower lobe pneumonia.  LABS (all labs ordered are listed, but only abnormal results are displayed) Labs interpreted as -    Labs Reviewed  CBC WITH DIFFERENTIAL/PLATELET - Abnormal; Notable for the following components:      Result Value   HCT 37.4 (*)    MCHC 36.6 (*)    Monocytes Absolute 1.4 (*)    All other components within normal limits  COMPREHENSIVE METABOLIC PANEL - Abnormal; Notable for the following components:   Sodium 120 (*)    Potassium 2.8 (*)    Chloride 86 (*)    Glucose, Bld 118 (*)  All other components within normal limits  TROPONIN I (HIGH SENSITIVITY) - Abnormal; Notable for the following components:   Troponin I (High Sensitivity) 33 (*)    All other components within normal limits  RESP PANEL BY RT-PCR (RSV, FLU A&B, COVID)  [id number]  CULTURE, BLOOD (ROUTINE X 2)  CULTURE, BLOOD (ROUTINE X 2)  LACTIC ACID, PLASMA  LACTIC ACID, PLASMA  MAGNESIUM     MDM    Patient with significant wheezing on exam.  Concern for COPD exacerbation in the setting of community-acquired pneumonia.  Given DuoNeb treatments and IV of Solu-Medrol .  Felt that 30 cc/kg of IV fluids may be detrimental to the patient, given a 1 L bolus slowly given significant hyponatremia.  Troponin mildly elevated and no active chest pain at this time, no EKG changes, have a low suspicion for ACS.  COVID and influenza testing are negative.  Added on blood cultures and obtained lactic acid.   Clinical picture concerning for community-acquired pneumonia and acute hyponatremia.   PROCEDURES:  Critical Care performed: yes  .Critical Care  Performed by: Suzanne Kirsch, MD Authorized by: Suzanne Kirsch, MD   Critical care provider statement:    Critical care time (minutes):  45   Critical care time was exclusive of:  Separately billable procedures and treating other patients   Critical care was necessary to treat or prevent imminent or life-threatening deterioration of the following conditions:  Metabolic crisis   Critical care was time spent personally by me on the following activities:  Development of treatment plan with patient or surrogate, discussions with consultants, evaluation of patient's response to treatment, examination of patient, ordering and review of laboratory studies, ordering and review of radiographic studies, ordering and performing treatments and interventions, pulse oximetry, re-evaluation of patient's condition and review of old charts   Care discussed with: admitting provider     Patient's presentation is most consistent with acute presentation with potential threat to life or bodily function.   MEDICATIONS ORDERED IN ED: Medications  cefTRIAXone  (ROCEPHIN ) 2 g in sodium chloride  0.9 % 100 mL IVPB (has no administration in time range)  azithromycin  (ZITHROMAX ) 500 mg in sodium chloride  0.9 % 250 mL IVPB (has no administration in time range)  potassium chloride  10 mEq in 100 mL IVPB (has no administration in time range)  ipratropium-albuterol  (DUONEB) 0.5-2.5 (3) MG/3ML nebulizer solution 9 mL (has no administration in time range)  methylPREDNISolone  sodium succinate (SOLU-MEDROL ) 125 mg/2 mL injection 125 mg (has no administration in time range)  sodium chloride  0.9 % bolus 1,000 mL (has no administration in time range)  potassium chloride  SA (KLOR-CON  M) CR tablet 40 mEq (has no administration in time range)    FINAL CLINICAL IMPRESSION(S) / ED  DIAGNOSES   Final diagnoses:  Pneumonia of left lower lobe due to infectious organism  Hyponatremia  Hypokalemia     Rx / DC Orders   ED Discharge Orders     None        Note:  This document was prepared using Dragon voice recognition software and may include unintentional dictation errors.   Suzanne Kirsch, MD 03/27/23 [phone]

## 2023-03-27 NOTE — H&P (Signed)
 History and Physical    Carl Ortega FMW:[id number] DOB: 02-03-1951 DOA: 03/27/2023  PCP: Knute Thersia Bitters, FNP (Confirm with patient/family/NH records and if not entered, this has to be entered at [facility] point of entry) Patient coming from: Home  I have personally briefly reviewed patient's old medical records in [facility]  Chief Complaint: Cough, wheezing, SOB  HPI: Carl Ortega is a 72 y.o. male with medical history significant of COPD, HTN, bipolar disorder, PTSD, presented with worsening of cough, wheezing, shortness of breath  Symptoms started 1 week ago patient started to have wheezing and productive cough with light yellowish sputum denies any fever chills no chest pains.  3 weeks ago, patient went to see PCP for routine visit and his BP medication adjusted after the visit, patient was started on hydrochlorothiazide  and lisinopril .  And patient's close friend reports that the patient blood pressure has been running low for the last 3 days with SBP high around 110s, patient started to feel nausea but no vomiting.  Does not complain of any lightheadedness weakness.  He smokes every day randomly from several cigarettes 1 pack, and uses albuterol  inhalers every day at baseline.  ED Course: Afebrile, blood pressure 110/60, O2 saturation 90% on room air none tachycardia none tachypneic.  Chest x-ray showed left lower lobe infiltrates likely pneumonia.  Blood work showed hemoglobin 13, WBC 10.0, sodium 120, potassium 2.8, chloride 86, bicarb 25, creatinine 1.2  Patient was given IV bolus 1000 x 1 and started on ceftriaxone  and azithromycin  in the ED.  Review of Systems: As per HPI otherwise 14 point review of systems negative.    Past Medical History:  Diagnosis Date   Allergic rhinitis 01/30/2023   Bipolar 1 disorder (HCC)    Brain injury (HCC)    Cocaine dependence, continuous (HCC) 01/30/2023   Dermatophytosis of nail 01/30/2023   GERD (gastroesophageal reflux disease)     Homeless 01/30/2023   Hypertension    Ingrowing nail 01/30/2023   Major depressive disorder, recurrent episode, in full remission (HCC) 01/30/2023   Migraine 01/30/2023   PTSD (post-traumatic stress disorder)    PTSD (post-traumatic stress disorder)     Past Surgical History:  Procedure Laterality Date   COLONOSCOPY WITH PROPOFOL  N/A 01/10/2017   Procedure: COLONOSCOPY WITH PROPOFOL ;  Surgeon: Dellie Louanne MATSU, MD;  Location: [facility] ENDOSCOPY;  Service: Endoscopy;  Laterality: N/A;   HERNIA REPAIR  2010   left groin   INGUINAL HERNIA REPAIR Right 01/16/2017   Procedure: HERNIA REPAIR INGUINAL ADULT WITH PARTIAL OMENTECTOMY;  Surgeon: Dellie Louanne MATSU, MD;  Location: [facility] ORS;  Service: General;  Laterality: Right;   none       reports that he has been smoking cigarettes. He has quit using smokeless tobacco. He reports current alcohol  use of about 4.0 standard drinks of alcohol  per week. He reports current drug use. Drug: Cocaine.  Allergies  Allergen Reactions   Carbamazepine      hyponatremia   Naproxen     Abdominal pain   Sulindac     Abdominal discomfort   Tromethamine    Ketorolac Rash and Hives    Family History  Problem Relation Age of Onset   Hypertension Other     Prior to Admission medications   Medication Sig Start Date End Date Taking? Authorizing Provider  amLODipine  (NORVASC ) 10 MG tablet Take 1 tablet (10 mg total) by mouth daily. 02/28/23  Yes Caudle, Thersia Bitters, FNP  atorvastatin  (LIPITOR) 40 MG tablet Take 1 tablet (  40 mg total) by mouth at bedtime. 02/06/23  Yes Caudle, Thersia Bitters, FNP  hydrALAZINE  (APRESOLINE ) 10 MG tablet Take 1 tablet (10 mg total) by mouth 2 (two) times daily as needed (if blood pressure reading is over 150/90). 02/28/23  Yes Caudle, Thersia Bitters, FNP  lisinopril -hydrochlorothiazide  (ZESTORETIC ) 20-25 MG tablet Take 2 tablets by mouth daily. 02/28/23  Yes Caudle, Thersia Bitters, FNP  propranolol  ER (INDERAL  LA) 80 MG  24 hr capsule Take 2 capsules (160 mg total) by mouth daily. 02/28/23  Yes Caudle, Thersia Bitters, FNP  QUEtiapine  (SEROQUEL ) 100 MG tablet Take 1 tablet (100 mg total) by mouth at bedtime. 02/28/23  Yes Caudle, Thersia Bitters, FNP  aspirin  EC 81 MG tablet Take 81 mg by mouth daily.    [provider]  cyanocobalamin  (VITAMIN B12) 1000 MCG tablet Take 1,000 mcg by mouth daily.    [provider]  Docusate Sodium  (DSS) 100 MG CAPS Take 100 mg by mouth daily.    [provider]  hydroxypropyl methylcellulose / hypromellose (ISOPTO TEARS / GONIOVISC) 2.5 % ophthalmic solution Place 1 drop into both eyes 2 (two) times daily.    [provider]  loratadine  (CLARITIN ) 10 MG tablet Take 10 mg by mouth daily as needed for allergies.    [provider]  Multiple Vitamins-Minerals (MULTIVITAMIN WITH MINERALS) tablet Take 1 tablet by mouth daily.    [provider]  omeprazole (PRILOSEC) 20 MG capsule Take 20 mg by mouth daily.    [provider]  sennosides-docusate sodium  (SENOKOT-S) 8.6-50 MG tablet Take 2 tablets by mouth daily. To prevent constipation    [provider]    Physical Exam: Vitals:   03/26/23 2241 03/27/23 0259 03/27/23 0600 03/27/23 0830  BP: 110/63 125/66 118/60 132/76  Pulse: 61 62 60 62  Resp: 17 16 18 17   Temp: 98.7 F (37.1 C) 98.7 F (37.1 C) 97.9 F (36.6 C) 98.7 F (37.1 C)  TempSrc: Oral Oral Oral   SpO2: 90% 99% 98% 96%    Constitutional: NAD, calm, comfortable Vitals:   03/26/23 2241 03/27/23 0259 03/27/23 0600 03/27/23 0830  BP: 110/63 125/66 118/60 132/76  Pulse: 61 62 60 62  Resp: 17 16 18 17   Temp: 98.7 F (37.1 C) 98.7 F (37.1 C) 97.9 F (36.6 C) 98.7 F (37.1 C)  TempSrc: Oral Oral Oral   SpO2: 90% 99% 98% 96%   Eyes: PERRL, lids and conjunctivae normal ENMT: Mucous membranes are moist. Posterior pharynx clear of any exudate or lesions.Normal dentition.  Neck: normal, supple, no  masses, no thyromegaly Respiratory: Diminished breathing sound bilaterally, scattered wheezing, crackles on left side, increasing respiratory effort. No accessory muscle use.  Cardiovascular: Regular rate and rhythm, no murmurs / rubs / gallops. No extremity edema. 2+ pedal pulses. No carotid bruits.  Abdomen: no tenderness, no masses palpated. No hepatosplenomegaly. Bowel sounds positive.  Musculoskeletal: no clubbing / cyanosis. No joint deformity upper and lower extremities. Good ROM, no contractures. Normal muscle tone.  Skin: no rashes, lesions, ulcers. No induration Neurologic: CN 2-12 grossly intact. Sensation intact, DTR normal. Strength 5/5 in all 4.  Psychiatric: Normal judgment and insight. Alert and oriented x 3. Normal mood.     Labs on Admission: I have personally reviewed following labs and imaging studies  CBC: Recent Labs  Lab 03/26/23 1835  WBC 10.0  NEUTROABS 7.1  HGB 13.7  HCT 37.4*  MCV 87.8  PLT 316   Basic Metabolic Panel: Recent Labs  Lab 03/26/23 1835  NA 120*  K 2.8*  CL 86*  CO2 25  GLUCOSE 118*  BUN 15  CREATININE 1.21  CALCIUM  9.0   GFR: CrCl cannot be calculated (Unknown ideal weight.). Liver Function Tests: Recent Labs  Lab 03/26/23 1835  AST 34  ALT 28  ALKPHOS 105  BILITOT 0.9  PROT 7.2  ALBUMIN 3.6   No results for input(s): LIPASE, AMYLASE in the last 168 hours. No results for input(s): AMMONIA in the last 168 hours. Coagulation Profile: No results for input(s): INR, PROTIME in the last 168 hours. Cardiac Enzymes: No results for input(s): CKTOTAL, CKMB, CKMBINDEX, TROPONINI in the last 168 hours. BNP (last 3 results) No results for input(s): PROBNP in the last 8760 hours. HbA1C: No results for input(s): HGBA1C in the last 72 hours. CBG: No results for input(s): GLUCAP in the last 168 hours. Lipid Profile: No results for input(s): CHOL, HDL, LDLCALC, TRIG, CHOLHDL, LDLDIRECT in the  last 72 hours. Thyroid Function Tests: No results for input(s): TSH, T4TOTAL, FREET4, T3FREE, THYROIDAB in the last 72 hours. Anemia Panel: No results for input(s): VITAMINB12, FOLATE, FERRITIN, TIBC, IRON, RETICCTPCT in the last 72 hours. Urine analysis:    Component Value Date/Time   COLORURINE COLORLESS (A) 08/30/2016 1022   APPEARANCEUR CLEAR (A) 08/30/2016 1022   APPEARANCEUR Clear 06/03/2012 1435   LABSPEC 1.002 (L) 08/30/2016 1022   LABSPEC 1.005 06/03/2012 1435   PHURINE 6.0 08/30/2016 1022   GLUCOSEU NEGATIVE 08/30/2016 1022   GLUCOSEU Negative 06/03/2012 1435   HGBUR NEGATIVE 08/30/2016 1022   BILIRUBINUR NEGATIVE 08/30/2016 1022   BILIRUBINUR Negative 06/03/2012 1435   KETONESUR NEGATIVE 08/30/2016 1022   PROTEINUR NEGATIVE 08/30/2016 1022   NITRITE NEGATIVE 08/30/2016 1022   LEUKOCYTESUR NEGATIVE 08/30/2016 1022   LEUKOCYTESUR Negative 06/03/2012 1435    Radiological Exams on Admission: DG Chest 2 View Result Date: 03/26/2023 CLINICAL DATA:  Cough and shortness of breath EXAM: CHEST - 2 VIEW COMPARISON:  03/13/2015 FINDINGS: Cardiac shadow is within normal limits. Aortic calcifications are noted. Left lower lobe infiltrate is noted in the retrocardiac region consistent with acute pneumonia. No sizable effusion is seen. No bony abnormality is noted. IMPRESSION: Left lower lobe pneumonia. Electronically Signed   By: Oneil Devonshire M.D.   On: 03/26/2023 20:47    EKG: Independently reviewed.  Sinus rhythm, no acute ST changes.  Assessment/Plan Principal Problem:   Hyponatremia Active Problems:   CAP (community acquired pneumonia)  (please populate well all problems here in Problem List. (For example, if patient is on BP meds at home and you resume or decide to hold them, it is a problem that needs to be her. Same for CAD, COPD, HLD and so on)  Acute hyponatremia -Euvolemic, etiology likely secondary to hydrochlorothiazide  -Discontinue  hydrochlorothiazide  -Hyponatremia study sent by ED physician -Repeat sodium level pending, plan to correct sodium level less than 0.5 mEq every hour, recheck sodium level this afternoon and tonight and tomorrow morning -Other DDx, lipid panel within normal limits on recent outpatient study.  Will check TSH  Severe hypokalemia -Likely secondary to hydrochlorothiazide  -IV and p.o. replacement, recheck potassium level this afternoon -Magnesium and phosphorus level pending  Left lung pneumonia, CAP, bacterial -Continue ceftriaxone  and azithromycin  -Send atypical pneumonia study including mycoplasma and Legionella -Sputum culture -Incentive spirometry and flutter valve  Acute COPD exacerbation -IV Solu-Medrol  -ICS and LABA -DuoNebs every 6 hours and as needed albuterol  -Patient follow-up with pulmonary at [facility] for lung function test  HTN -BP borderline low, hold off home BP meds -Start as needed hydralazine   Cigarette smoking -Cessation education performed by bedside -Nicotine  patch  DVT prophylaxis: Lovenox  Code Status: Full code Family Communication: Friend/POA at bedside Disposition Plan: Patient is sick with severe hyponatremia and pneumonia, requiring IV antibiotics and closely monitoring sodium level, expect more than 2 midnight hospital stay Consults called: None Admission status: Telemetry admission   Cort ONEIDA Mana MD [facility] Pager [phone]  03/27/2023, 11:00 AM

## 2023-03-27 NOTE — ED Notes (Signed)
Pt friend at bedside 

## 2023-03-27 NOTE — ED Notes (Addendum)
 Hospitalist messaged due to pt hallucinating and increased confusion, no response. RN went into pt room and found pt with mask on top of head. Pt stated he needed to go to the bathroom and attempted to go into the wall to go to the bathroom. Pt is redirectable.

## 2023-03-28 DIAGNOSIS — J189 Pneumonia, unspecified organism: Secondary | ICD-10-CM

## 2023-03-28 DIAGNOSIS — E876 Hypokalemia: Secondary | ICD-10-CM

## 2023-03-28 DIAGNOSIS — J441 Chronic obstructive pulmonary disease with (acute) exacerbation: Secondary | ICD-10-CM | POA: Diagnosis not present

## 2023-03-28 DIAGNOSIS — I10 Essential (primary) hypertension: Secondary | ICD-10-CM

## 2023-03-28 DIAGNOSIS — J449 Chronic obstructive pulmonary disease, unspecified: Secondary | ICD-10-CM

## 2023-03-28 DIAGNOSIS — F4310 Post-traumatic stress disorder, unspecified: Secondary | ICD-10-CM

## 2023-03-28 DIAGNOSIS — E871 Hypo-osmolality and hyponatremia: Secondary | ICD-10-CM | POA: Diagnosis not present

## 2023-03-28 HISTORY — DX: Pneumonia, unspecified organism: J18.9

## 2023-03-28 HISTORY — DX: Other disorders of phosphorus metabolism: E83.39

## 2023-03-28 HISTORY — DX: Hypokalemia: E87.6

## 2023-03-28 LAB — BASIC METABOLIC PANEL
Anion gap: 11 (ref 5–15)
BUN: 15 mg/dL (ref 8–23)
CO2: 23 mmol/L (ref 22–32)
Calcium: 9 mg/dL (ref 8.9–10.3)
Chloride: 96 mmol/L — ABNORMAL LOW (ref 98–111)
Creatinine, Ser: 0.86 mg/dL (ref 0.61–1.24)
GFR, Estimated: >60 mL/min (ref >60)
Glucose, Bld: 136 mg/dL — ABNORMAL HIGH (ref 70–99)
Potassium: 3.7 mmol/L (ref 3.5–5.1)
Sodium: 130 mmol/L — ABNORMAL LOW (ref 135–145)

## 2023-03-28 LAB — LEGIONELLA PNEUMOPHILA SEROGP 1 UR AG: L. pneumophila Serogp 1 Ur Ag: NEGATIVE

## 2023-03-28 LAB — CBC
HCT: 36.8 % — ABNORMAL LOW (ref 39.0–52.0)
Hemoglobin: 13.3 g/dL (ref 13.0–17.0)
MCH: 32.7 pg (ref 26.0–34.0)
MCHC: 36.1 g/dL — ABNORMAL HIGH (ref 30.0–36.0)
MCV: 90.4 fL (ref 80.0–100.0)
Platelets: 317 10*3/uL (ref 150–400)
RBC: 4.07 MIL/uL — ABNORMAL LOW (ref 4.22–5.81)
RDW: 11.8 % (ref 11.5–15.5)
WBC: 12 10*3/uL — ABNORMAL HIGH (ref 4.0–10.5)
nRBC: 0 % (ref 0.0–0.2)

## 2023-03-28 LAB — PHOSPHORUS: Phosphorus: 2.8 mg/dL (ref 2.5–4.6)

## 2023-03-28 MED ORDER — MOMETASONE FURO-FORMOTEROL FUM 200-5 MCG/ACT IN AERO: 2.0000 | INHALATION_SPRAY | Freq: Two times a day (BID) | RESPIRATORY_TRACT | 0 refills | Status: DC

## 2023-03-28 MED ORDER — AMOXICILLIN-POT CLAVULANATE 875-125 MG PO TABS
1.0000 | ORAL_TABLET | Freq: Two times a day (BID) | ORAL | 0 refills | Status: AC
Start: 2023-03-29 — End: 2023-04-01

## 2023-03-28 MED ORDER — AZITHROMYCIN 250 MG PO TABS: ORAL_TABLET | ORAL | 0 refills | Status: DC

## 2023-03-28 MED ORDER — PREDNISONE 10 MG PO TABS: ORAL_TABLET | ORAL | 0 refills | Status: DC

## 2023-03-28 NOTE — ED Notes (Signed)
 Patient wondering around room.  Able to be redirected, but continues to remain confused about situation.  IV sites wrapped at this time.  Patient noted to be tugging at site.  Pt will not keep monitoring equipment on at this time.  Will continue to monitor

## 2023-03-28 NOTE — Assessment & Plan Note (Signed)
 Sodium 120 on presentation.  130 upon discharge.  I advised patient to stop taking the Zestoretic  which contains lisinopril  and hydrochlorothiazide .  Hydrochlorothiazide  likely the culprit of the low sodium.  Recommend checking a BMP and follow-up appointment.

## 2023-03-28 NOTE — Discharge Summary (Signed)
 Physician Discharge Summary   Patient: Carl Ortega MRN: [id number] DOB: 01-31-1951  Admit date:     03/27/2023  Discharge date: 03/28/23  Discharge Physician: Charlie Patterson   PCP: Knute Thersia Bitters, FNP   Recommendations at discharge:   Follow-up PCP 5 days Follow-up pulmonology as outpatient  Discharge Diagnoses: Principal Problem:   Hyponatremia Active Problems:   Multifocal pneumonia   COPD with acute exacerbation (HCC)   Hypokalemia   Hypophosphatemia   Essential hypertension   PTSD (post-traumatic stress disorder)  Resolved Problems:   * No resolved hospital problems. *  Hospital Course: 73 year old man past medical history of COPD, hypertension, bipolar disorder, posttraumatic stress disorder presents with worsening cough and shortness of breath and found to have hyponatremia.  Patient was recently started on hydrochlorothiazide  lisinopril  combination.  Patient was found to have a sodium of 120 upon presentation.  Also found to have multifocal pneumonia.  Patient was started on Rocephin  and Zithromax .  1/1.  Patient feeling better does not know why he is here.  Wants to go home.  Agreeable to Rocephin  and Zithromax  and steroid prior to discharge.  Sodium improved to 130.  Advised not to take Zestoretic  (lisinopril  hydrochlorothiazide ).  Assessment and Plan: * Hyponatremia Sodium 120 on presentation.  130 upon discharge.  I advised patient to stop taking the Zestoretic  which contains lisinopril  and hydrochlorothiazide .  Hydrochlorothiazide  likely the culprit of the low sodium.  Recommend checking a BMP and follow-up appointment.  Multifocal pneumonia Seen on CT scan.  Also tree-in-bud nodularity seen.  Will refer to pulmonology as outpatient.  Patient received Rocephin  and Zithromax  here and will discharge home on a few more days of Augmentin  and Zithromax .  COPD with acute exacerbation (HCC) 3 days of prednisone  given upon discharge.  Continue Dulera   inhaler  Hypophosphatemia Also replaced during the hospital course.  Hypokalemia Secondary to hydrochlorothiazide   Essential hypertension Follow-up as outpatient.  Can continue usual medications but hold lisinopril  hydrochlorothiazide .  PTSD (post-traumatic stress disorder) Continue psychiatric medication         Consultants: None Procedures performed: None Disposition: Home Diet recommendation:  Cardiac diet DISCHARGE MEDICATION: Allergies as of 03/28/2023       Reactions   Carbamazepine     hyponatremia   Naproxen    Abdominal pain   Sulindac    Abdominal discomfort   Tromethamine    Ketorolac Rash, Hives        Medication List     STOP taking these medications    lisinopril -hydrochlorothiazide  20-25 MG tablet Commonly known as: ZESTORETIC        TAKE these medications    amLODipine  10 MG tablet Commonly known as: NORVASC  Take 1 tablet (10 mg total) by mouth daily.   amoxicillin -clavulanate 875-125 MG tablet Commonly known as: AUGMENTIN  Take 1 tablet by mouth 2 (two) times daily for 3 days. Start taking on: March 29, 2023   aspirin  EC 81 MG tablet Take 81 mg by mouth daily.   atorvastatin  40 MG tablet Commonly known as: LIPITOR Take 1 tablet (40 mg total) by mouth at bedtime.   azithromycin  250 MG tablet Commonly known as: Zithromax  One tab po daily for three days Start taking on: March 29, 2023   cyanocobalamin  1000 MCG tablet Commonly known as: VITAMIN B12 Take 1,000 mcg by mouth daily.   DSS 100 MG Caps Take 100 mg by mouth daily.   hydrALAZINE  10 MG tablet Commonly known as: APRESOLINE  Take 1 tablet (10 mg total) by mouth 2 (two) times  daily as needed (if blood pressure reading is over 150/90).   hydroxypropyl methylcellulose / hypromellose 2.5 % ophthalmic solution Commonly known as: ISOPTO TEARS / GONIOVISC Place 1 drop into both eyes 2 (two) times daily.   loratadine  10 MG tablet Commonly known as: CLARITIN  Take 10 mg  by mouth daily as needed for allergies.   mometasone -formoterol  200-5 MCG/ACT Aero Commonly known as: DULERA  Inhale 2 puffs into the lungs 2 (two) times daily.   multivitamin with minerals tablet Take 1 tablet by mouth daily.   omeprazole 20 MG capsule Commonly known as: PRILOSEC Take 20 mg by mouth daily.   predniSONE  10 MG tablet Commonly known as: DELTASONE  4 tabs po daily for 3 days Start taking on: March 29, 2023   propranolol  ER 80 MG 24 hr capsule Commonly known as: INDERAL  LA Take 2 capsules (160 mg total) by mouth daily.   QUEtiapine  100 MG tablet Commonly known as: SEROQUEL  Take 1 tablet (100 mg total) by mouth at bedtime.   sennosides-docusate sodium  8.6-50 MG tablet Commonly known as: SENOKOT-S Take 2 tablets by mouth daily. To prevent constipation        Follow-up Information     Knute Thersia Bitters, FNP Follow up in 5 day(s).   Specialty: Family Medicine Contact information: [address] [place] [address] [phone]         Parris Manna, MD Follow up in 2 week(s).   Specialty: Pulmonary Disease Contact information: [address] [place] [address] [phone]                Discharge Exam: Fredricka Weights   03/27/23 1112  Weight: 67.1 kg   Physical Exam HENT:     Head: Normocephalic.  Eyes:     General: Lids are normal.  Cardiovascular:     Rate and Rhythm: Normal rate and regular rhythm.     Heart sounds: Normal heart sounds, S1 normal and S2 normal.  Pulmonary:     Breath sounds: Examination of the right-lower field reveals decreased breath sounds. Examination of the left-lower field reveals decreased breath sounds. Decreased breath sounds present. No wheezing, rhonchi or rales.  Abdominal:     Palpations: Abdomen is soft.     Tenderness: There is no abdominal tenderness.  Musculoskeletal:     Right lower leg: No swelling.     Left lower leg: No swelling.  Skin:     General: Skin is warm.     Findings: No rash.  Neurological:     Mental Status: He is alert and oriented to person, place, and time.      Condition at discharge: stable  The results of significant diagnostics from this hospitalization (including imaging, microbiology, ancillary and laboratory) are listed below for reference.   Imaging Studies: CT CHEST WO CONTRAST Result Date: 03/27/2023 CLINICAL DATA:  Cough, shortness of breath, and chest pain. Recent diagnosis of pneumonia. EXAM: CT CHEST WITHOUT CONTRAST TECHNIQUE: Multidetector CT imaging of the chest was performed following the standard protocol without IV contrast. RADIATION DOSE REDUCTION: This exam was performed according to the departmental dose-optimization program which includes automated exposure control, adjustment of the mA and/or kV according to patient size and/or use of iterative reconstruction technique. COMPARISON:  Chest radiograph dated 03/26/2023, CT chest dated 03/13/2015 FINDINGS: Decreased sensitivity and specificity for detailed findings due to motion artifact. Cardiovascular: Normal heart size. No significant pericardial fluid/thickening. Great vessels are normal in course and caliber. Coronary artery calcifications and aortic atherosclerosis.  Mediastinum/Nodes: Imaged thyroid gland without nodules meeting criteria for imaging follow-up by size. Small hiatal hernia. No pathologically enlarged axillary, supraclavicular, mediastinal, or hilar lymph nodes. Lungs/Pleura: The central airways are patent. Diffuse tree-in-bud nodules in the lingula and left lower lobe with irregular consolidation in the left lower lobe. Subsegmental lingular atelectasis. No pneumothorax. No pleural effusion. Upper abdomen: Normal. Musculoskeletal: No acute or abnormal lytic or blastic osseous lesions. Multilevel degenerative changes of the thoracic spine. IMPRESSION: 1. Diffuse tree-in-bud nodules in the lingula and left lower lobe with irregular  consolidation in the left lower lobe, consistent with multifocal pneumonia. 2. Aortic Atherosclerosis (ICD10-I70.0). Coronary artery calcifications. Assessment for potential risk factor modification, dietary therapy or pharmacologic therapy may be warranted, if clinically indicated. Electronically Signed   By: Limin  Xu M.D.   On: 03/27/2023 13:39   DG Chest 2 View Result Date: 03/26/2023 CLINICAL DATA:  Cough and shortness of breath EXAM: CHEST - 2 VIEW COMPARISON:  03/13/2015 FINDINGS: Cardiac shadow is within normal limits. Aortic calcifications are noted. Left lower lobe infiltrate is noted in the retrocardiac region consistent with acute pneumonia. No sizable effusion is seen. No bony abnormality is noted. IMPRESSION: Left lower lobe pneumonia. Electronically Signed   By: Oneil Devonshire M.D.   On: 03/26/2023 20:47    Microbiology: Results for orders placed or performed during the hospital encounter of 03/27/23  Resp panel by RT-PCR (RSV, Flu A&B, Covid) Anterior Nasal Swab     Status: None   Collection Time: 03/26/23 10:19 PM   Specimen: Anterior Nasal Swab  Result Value Ref Range Status   SARS Coronavirus 2 by RT PCR NEGATIVE NEGATIVE Final    Comment: (NOTE) SARS-CoV-2 target nucleic acids are NOT DETECTED.  The SARS-CoV-2 RNA is generally detectable in upper respiratory specimens during the acute phase of infection. The lowest concentration of SARS-CoV-2 viral copies this assay can detect is 138 copies/mL. A negative result does not preclude SARS-Cov-2 infection and should not be used as the sole basis for treatment or other patient management decisions. A negative result may occur with  improper specimen collection/handling, submission of specimen other than nasopharyngeal swab, presence of viral mutation(s) within the areas targeted by this assay, and inadequate number of viral copies(<138 copies/mL). A negative result must be combined with clinical observations, patient history,  and epidemiological information. The expected result is Negative.  Fact Sheet for Patients:  bloggercourse.com  Fact Sheet for Healthcare Providers:  seriousbroker.it  This test is no t yet approved or cleared by the [place]  FDA and  has been authorized for detection and/or diagnosis of SARS-CoV-2 by FDA under an Emergency Use Authorization (EUA). This EUA will remain  in effect (meaning this test can be used) for the duration of the COVID-19 declaration under Section 564(b)(1) of the Act, 21 U.S.C.section 360bbb-3(b)(1), unless the authorization is terminated  or revoked sooner.       Influenza A by PCR NEGATIVE NEGATIVE Final   Influenza B by PCR NEGATIVE NEGATIVE Final    Comment: (NOTE) The Xpert Xpress SARS-CoV-2/FLU/RSV plus assay is intended as an aid in the diagnosis of influenza from Nasopharyngeal swab specimens and should not be used as a sole basis for treatment. Nasal washings and aspirates are unacceptable for Xpert Xpress SARS-CoV-2/FLU/RSV testing.  Fact Sheet for Patients: bloggercourse.com  Fact Sheet for Healthcare Providers: seriousbroker.it  This test is not yet approved or cleared by the [place]  FDA and has been authorized for detection and/or diagnosis of SARS-CoV-2 by  FDA under an Emergency Use Authorization (EUA). This EUA will remain in effect (meaning this test can be used) for the duration of the COVID-19 declaration under Section 564(b)(1) of the Act, 21 U.S.C. section 360bbb-3(b)(1), unless the authorization is terminated or revoked.     Resp Syncytial Virus by PCR NEGATIVE NEGATIVE Final    Comment: (NOTE) Fact Sheet for Patients: bloggercourse.com  Fact Sheet for Healthcare Providers: seriousbroker.it  This test is not yet approved or cleared by the [place]  FDA and has  been authorized for detection and/or diagnosis of SARS-CoV-2 by FDA under an Emergency Use Authorization (EUA). This EUA will remain in effect (meaning this test can be used) for the duration of the COVID-19 declaration under Section 564(b)(1) of the Act, 21 U.S.C. section 360bbb-3(b)(1), unless the authorization is terminated or revoked.  Performed at [facility], [address] [place], [place] [address]   Blood Culture (routine x 2)     Status: None (Preliminary result)   Collection Time: 03/27/23  9:40 AM   Specimen: BLOOD  Result Value Ref Range Status   Specimen Description BLOOD RIGHT FA  Final   Special Requests   Final    BOTTLES DRAWN AEROBIC AND ANAEROBIC Blood Culture results may not be optimal due to an inadequate volume of blood received in culture bottles   Culture   Final    NO GROWTH < 24 HOURS Performed at [facility], [address] [place], [place] [address]    Report Status PENDING  Incomplete  Blood Culture (routine x 2)     Status: None (Preliminary result)   Collection Time: 03/27/23  9:40 AM   Specimen: BLOOD  Result Value Ref Range Status   Specimen Description BLOOD RIGHT [facility]  Final   Special Requests   Final    BOTTLES DRAWN AEROBIC AND ANAEROBIC Blood Culture results may not be optimal due to an inadequate volume of blood received in culture bottles   Culture   Final    NO GROWTH < 24 HOURS Performed at [facility], [address] [place], [place] [address]    Report Status PENDING  Incomplete    Labs: CBC: Recent Labs  Lab 03/26/23 1835 03/28/23 0754  WBC 10.0 12.0*  NEUTROABS 7.1  --   HGB 13.7 13.3  HCT 37.4* 36.8*  MCV 87.8 90.4  PLT 316 317   Basic Metabolic Panel: Recent Labs  Lab 03/26/23 1835 03/27/23 1225 03/27/23 1420 03/28/23 0754  NA 120*  --  130* 130*  K 2.8*  --  3.9 3.7  CL 86*  --  95* 96*  CO2 25  --  20* 23  GLUCOSE 118*  --  159* 136*  BUN 15  --  14 15  CREATININE 1.21   --  1.10 0.86  CALCIUM  9.0  --  8.8* 9.0  MG  --  1.8  --   --   PHOS  --  1.2*  --  2.8   Liver Function Tests: Recent Labs  Lab 03/26/23 1835  AST 34  ALT 28  ALKPHOS 105  BILITOT 0.9  PROT 7.2  ALBUMIN 3.6   CBG: No results for input(s): GLUCAP in the last 168 hours.  Discharge time spent: greater than 30 minutes.  Signed: Charlie Patterson, MD [facility] 03/28/2023

## 2023-03-28 NOTE — Assessment & Plan Note (Signed)
 Seen on CT scan.  Also tree-in-bud nodularity seen.  Will refer to pulmonology as outpatient.  Patient received Rocephin and Zithromax here and will discharge home on a few more days of Augmentin and Zithromax.

## 2023-03-28 NOTE — Assessment & Plan Note (Signed)
 Secondary to hydrochlorothiazide

## 2023-03-28 NOTE — Assessment & Plan Note (Signed)
 Follow-up as outpatient.  Can continue usual medications but hold lisinopril hydrochlorothiazide.

## 2023-03-28 NOTE — Hospital Course (Signed)
 73 year old man past medical history of COPD, hypertension, bipolar disorder, posttraumatic stress disorder presents with worsening cough and shortness of breath and found to have hyponatremia.  Patient was recently started on hydrochlorothiazide  lisinopril  combination.  Patient was found to have a sodium of 120 upon presentation.  Also found to have multifocal pneumonia.  Patient was started on Rocephin  and Zithromax .  1/1.  Patient feeling better does not know why he is here.  Wants to go home.  Agreeable to Rocephin  and Zithromax  and steroid prior to discharge.  Sodium improved to 130.  Advised not to take Zestoretic  (lisinopril  hydrochlorothiazide ).

## 2023-03-28 NOTE — ED Notes (Signed)
 Patient found wondering down hallway.  Steady gait noted.  Pt remains confused about situation.  Patient returned to room and move to room 31 to be in direct view of nursing station

## 2023-03-28 NOTE — Assessment & Plan Note (Signed)
Continue psychiatric medication

## 2023-03-28 NOTE — TOC Transition Note (Signed)
 Transition of Care [facility]) - Discharge Note   Patient Details  Name: Carl Ortega MRN: [id number] Date of Birth: November 23, 1950  [facility]) CM/SW Contact:  Lauraine JAYSON Carpen, LCSW Phone Number: 03/28/2023, 11:34 AM   Clinical Narrative:   Patient has orders to discharge home today. Readmission prevention screen complete. CSW met with patient. Alyse Lye, at bedside. CSW introduced role and explained that discharge planning would be discussed. PCP is Thersia Stark, FNP. His friend drives him to appointments or he uses transportation through the [place]. Pharmacy is [facility] on the corner of [address] and [address]. No issues obtaining medications. Patient lives home alone. No home health or DME use prior to admission. No further concerns. Lye will drive him home today. CSW signing off.  Final next level of care: Home/Self Care Barriers to Discharge: No Barriers Identified   Patient Goals and CMS Choice            Discharge Placement                Patient to be transferred to facility by: Friend Name of family member notified: Lye Fetters Patient and family notified of of transfer: 03/28/23  Discharge Plan and Services Additional resources added to the After Visit Summary for                                       Social Drivers of Health (SDOH) Interventions SDOH Screenings   Food Insecurity: No Food Insecurity (02/25/2023)  Housing: Low Risk  (02/25/2023)  Transportation Needs: No Transportation Needs (03/28/2023)  Utilities: Not At Risk (01/30/2023)  Alcohol  Screen: Medium Risk (02/25/2023)  Depression (PHQ2-9): Low Risk  (03/06/2023)  Financial Resource Strain: Low Risk  (02/25/2023)  Physical Activity: Insufficiently Active (02/25/2023)  Social Connections: Socially Isolated (02/25/2023)  Stress: No Stress Concern Present (02/25/2023)  Tobacco Use: High Risk (03/06/2023)  Health Literacy: Adequate Health Literacy (01/30/2023)      Readmission Risk Interventions    03/28/2023   11:33 AM  Readmission Risk Prevention Plan  Transportation Screening Complete  PCP or Specialist Appt within 3-5 Days Complete  Social Work Consult for Recovery Care Planning/Counseling Complete  Palliative Care Screening Not Applicable  Medication Review [employer]) Complete

## 2023-03-28 NOTE — Discharge Instructions (Signed)
 Do Not take zestoretic (lisinopril/Hydrochlorothiazide)

## 2023-03-28 NOTE — Assessment & Plan Note (Addendum)
 3 days of prednisone given upon discharge.  Continue Dulera inhaler

## 2023-03-28 NOTE — Assessment & Plan Note (Signed)
 Also replaced during the hospital course.

## 2023-03-30 LAB — MYCOPLASMA PNEUMONIAE ANTIBODY, IGM: Mycoplasma pneumo IgM: 885 U/mL — ABNORMAL HIGH (ref 0–769)

## 2023-04-01 LAB — CULTURE, BLOOD (ROUTINE X 2)
Culture: NO GROWTH
Culture: NO GROWTH

## 2023-04-03 ENCOUNTER — Inpatient Hospital Stay (HOSPITAL_BASED_OUTPATIENT_CLINIC_OR_DEPARTMENT_OTHER): Payer: Medicare Other | Admitting: Family Medicine

## 2023-04-04 ENCOUNTER — Encounter (HOSPITAL_BASED_OUTPATIENT_CLINIC_OR_DEPARTMENT_OTHER): Payer: Self-pay | Admitting: Family Medicine

## 2023-04-04 ENCOUNTER — Ambulatory Visit (HOSPITAL_BASED_OUTPATIENT_CLINIC_OR_DEPARTMENT_OTHER): Payer: Medicare Other | Admitting: Family Medicine

## 2023-04-04 VITALS — BP 115/67 | HR 67 | Ht 65.0 in | Wt 149.0 lb

## 2023-04-04 DIAGNOSIS — E876 Hypokalemia: Secondary | ICD-10-CM | POA: Diagnosis not present

## 2023-04-04 DIAGNOSIS — E871 Hypo-osmolality and hyponatremia: Secondary | ICD-10-CM | POA: Diagnosis not present

## 2023-04-04 DIAGNOSIS — Z09 Encounter for follow-up examination after completed treatment for conditions other than malignant neoplasm: Secondary | ICD-10-CM | POA: Diagnosis not present

## 2023-04-04 DIAGNOSIS — J449 Chronic obstructive pulmonary disease, unspecified: Secondary | ICD-10-CM | POA: Diagnosis not present

## 2023-04-04 MED ORDER — BREZTRI AEROSPHERE 160-9-4.8 MCG/ACT IN AERO: 2.0000 | INHALATION_SPRAY | Freq: Two times a day (BID) | RESPIRATORY_TRACT | 11 refills | Status: DC

## 2023-04-04 NOTE — Progress Notes (Signed)
 Subjective:   Carl Ortega 01-07-1951 04/04/2023  Chief Complaint  Patient presents with   Hospitalization Follow-up    Patient was recently in the hospital after being diagnosed with pneumonia. Patient is doing much better after hospital stay.    HPI: HOSPITAL FOLLOW UP:  Carl Ortega presents for hospital follow up.  Hospital/Facility: [facility]  Discharge Diagnosis:  Hyponatremia Multifocal pneumonia COPD with acute exacerbation Hypokalemia Hypophosphatemia Essential hypertension PTSD Admission Date: 03/27/23  Discharge Date: 03/28/23 TCM call Date: N/a  New Medications: Augmentin , Zithromax , prednisone , Dulera  Discontinued Medications: Lisinopril  HCTZ   Referrals: Pulmonology  Carl Ortega is a 73 year old male with history of COPD, hypertension, PTSD, bipolar disorder that presented to urgent care on March 26, 2023 with shortness of breath, worsening cough and fatigue.  He had recently been restarted on his blood pressure medications at the beginning of December.  Patient was sent to [facility], ER after urgent care visit due to finding of pneumonia on chest x-ray.     Patient was found to be hypokalemic, hyponatremic with sodium of 120 on presentation.  Sodium was 130 on discharge.  Patient has chronic hyponatremia.  He had lisinopril  HCTZ discontinued.  Hypophosphatemia corrected prior to discharge.  Will obtain BMP and CBC per discharge recommendations for follow-up.  Patient's caregiver reports control of blood pressure at home currently.  Patient was treated for multifocal pneumonia with antibiotics Rocephin  and Zithromax  and was discharged home with Augmentin  and Zithromax .  Patient's caregiver reports he has completed antibiotic therapy.  He was referred to pulmonology for follow-up of tree-in-bud nodularity seen on CT scan.  He was started on Dulera  for COPD and given a round of prednisone  for acute exacerbation.  He has completed this as well and  continues to use a Dulera .  Patient's caregiver states that Dulera  is over $300 and would like a different prescription for inhaler that is covered by his insurance.  Patient and patient's caregiver reports improvement in shortness of breath yes cough.  Caregiver has not heard from pulmonology to set up appointment.     The following portions of the patient's history were reviewed and updated as appropriate: past medical history, past surgical history, family history, social history, allergies, medications, and problem list.   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, abuse (HCC) 01/30/2023   Episodic mood disorder (HCC) 01/30/2023   Bipolar I disorder (HCC) 01/30/2023   Asthma 01/30/2023   Antisocial personality disorder (HCC) 01/30/2023   Alcohol  dependence, continuous (HCC) 01/30/2023   Chronic schizoaffective schizophrenia (HCC) 01/30/2023   Alcohol  dependence (HCC) 01/30/2023   Cognitive impairment 01/30/2023   Hx of traumatic brain injury 01/30/2023   MDD (major depressive disorder), recurrent severe, 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   Adjustment disorder with disturbance of emotion 04/02/2015   Hyponatremia 03/13/2015   PTSD (post-traumatic stress disorder) 12/03/2014   Essential hypertension 12/03/2014   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   Pure hypertriglyceridemia 03/28/1999   Past Medical History:  Diagnosis Date   Acute hepatitis C 01/30/2023   Allergic rhinitis 01/30/2023   Bipolar 1 disorder (HCC)    Brain injury (  HCC)    Cocaine abuse (HCC) 12/03/2014    Cocaine dependence, continuous (HCC) 01/30/2023   Dermatophytosis of nail 01/30/2023   GERD (gastroesophageal reflux disease)    Hernia of abdominal cavity 10/30/2016   Homeless 01/30/2023   Hypertension    Ingrowing nail 01/30/2023   Major depressive disorder, recurrent episode, in full remission (HCC) 01/30/2023   Migraine 01/30/2023   PTSD (post-traumatic stress disorder)    PTSD (post-traumatic stress disorder)    Past Surgical History:  Procedure Laterality Date   COLONOSCOPY WITH PROPOFOL  N/A 01/10/2017   Procedure: COLONOSCOPY WITH PROPOFOL ;  Surgeon: Dellie Louanne MATSU, MD;  Location: [facility] ENDOSCOPY;  Service: Endoscopy;  Laterality: N/A;   HERNIA REPAIR  2010   left groin   INGUINAL HERNIA REPAIR Right 01/16/2017   Procedure: HERNIA REPAIR INGUINAL ADULT WITH PARTIAL OMENTECTOMY;  Surgeon: Dellie Louanne MATSU, MD;  Location: [facility] ORS;  Service: General;  Laterality: Right;   none     Family History  Problem Relation Age of Onset   Hypertension Other    Outpatient Medications Prior to Visit  Medication Sig Dispense Refill   amLODipine  (NORVASC ) 10 MG tablet Take 1 tablet (10 mg total) by mouth daily. 90 tablet 3   aspirin  EC 81 MG tablet Take 81 mg by mouth daily.     atorvastatin  (LIPITOR) 40 MG tablet Take 1 tablet (40 mg total) by mouth at bedtime. 90 tablet 3   cyanocobalamin  (VITAMIN B12) 1000 MCG tablet Take 1,000 mcg by mouth daily.     Docusate Sodium  (DSS) 100 MG CAPS Take 100 mg by mouth daily.     hydrALAZINE  (APRESOLINE ) 10 MG tablet Take 1 tablet (10 mg total) by mouth 2 (two) times daily as needed (if blood pressure reading is over 150/90). 60 tablet 3   hydroxypropyl methylcellulose / hypromellose (ISOPTO TEARS / GONIOVISC) 2.5 % ophthalmic solution Place 1 drop into both eyes 2 (two) times daily.     loratadine  (CLARITIN ) 10 MG tablet Take 10 mg by mouth daily as needed for allergies.     Multiple Vitamins-Minerals (MULTIVITAMIN WITH MINERALS) tablet  Take 1 tablet by mouth daily.     omeprazole (PRILOSEC) 20 MG capsule Take 20 mg by mouth daily.     propranolol  ER (INDERAL  LA) 80 MG 24 hr capsule Take 2 capsules (160 mg total) by mouth daily. 180 capsule 3   QUEtiapine  (SEROQUEL ) 100 MG tablet Take 1 tablet (100 mg total) by mouth at bedtime. 90 tablet 3   sennosides-docusate sodium  (SENOKOT-S) 8.6-50 MG tablet Take 2 tablets by mouth daily. To prevent constipation     mometasone -formoterol  (DULERA ) 200-5 MCG/ACT AERO Inhale 2 puffs into the lungs 2 (two) times daily. 1 each 0   azithromycin  (ZITHROMAX ) 250 MG tablet One tab po daily for three days 3 each 0   predniSONE  (DELTASONE ) 10 MG tablet 4 tabs po daily for 3 days 12 tablet 0   No facility-administered medications prior to visit.   Allergies  Allergen Reactions   Carbamazepine      hyponatremia   Naproxen     Abdominal pain   Sulindac     Abdominal discomfort   Tromethamine    Ketorolac Rash and Hives     ROS: A complete ROS was performed with pertinent positives/negatives noted in the HPI. The remainder of the ROS are negative.    Objective:   Today's Vitals   04/04/23 1512  BP: 115/67  Pulse: 67  SpO2: 95%  Weight: 149  lb (67.6 kg)  Height: 5' 5 (1.651 m)    Physical Exam          GENERAL: Well-appearing, in NAD. Well nourished.  SKIN: Pink, warm and dry. No rash, lesion, ulceration, or ecchymoses.  Head: Normocephalic. NECK: Trachea midline. Full ROM w/o pain or tenderness. RESPIRATORY: Chest wall symmetrical. Respirations even and non-labored. Breath sounds clear to auscultation bilaterally.  No wheezing or rhonchi on exam cough is congested, nonproductive. CARDIAC: S1, S2 present, regular rate and rhythm without murmur or gallops. Peripheral pulses 2+ bilaterally.  MSK: Muscle tone and strength appropriate for age.  EXTREMITIES: Without clubbing, cyanosis, or edema.  NEUROLOGIC: No motor or sensory deficits. Steady, even gait. C2-C12 intact.   PSYCH/MENTAL STATUS: Alert, oriented x baseline with dementia. Cooperative, appropriate mood and affect.      Assessment & Plan:   1. Hospital discharge follow-up (Primary) Will obtain CBC and BMP for follow-up.  Referral placed to pulmonology follow-up of tree-in-bud irregularity at [facility] per patient preference for location.  Patient has completed antibiotic and prednisone  taper as directed.  Recommend repeat imaging in 4 to 6 weeks for pneumonia clearance with pulmonology. - Basic Metabolic Panel (BMET) - CBC with Differential - Ambulatory referral to Pulmonology  2. Chronic obstructive pulmonary disease, unspecified COPD type (HCC) Stop Dulera .  Start Breztri  inhaler 2 puffs twice a day.  Sample given in office today.  Referral placed to pulmonology for CT abnormality seen during hospital stay.  Patient's caregiver will call PCP if Breztri  is not covered by insurance. - Budeson-Glycopyrrol-Formoterol  (BREZTRI  AEROSPHERE) 160-9-4.8 MCG/ACT AERO; Inhale 2 puffs into the lungs 2 (two) times daily.  Dispense: 10.7 g; Refill: 11 - Ambulatory referral to Pulmonology  3. Hyponatremia Improved at time of discharge.  Will repeat BMP today. - Basic Metabolic Panel (BMET)  4. Hypokalemia Improved at time of discharge, repeat BMP today.  Lisinopril  HCTZ discontinued. - Basic Metabolic Panel (BMET)  5. Hypophosphatemia Resolved.  Meds ordered this encounter  Medications   Budeson-Glycopyrrol-Formoterol  (BREZTRI  AEROSPHERE) 160-9-4.8 MCG/ACT AERO    Sig: Inhale 2 puffs into the lungs 2 (two) times daily.    Dispense:  10.7 g    Refill:  11    Supervising Provider:   DE CUBA, RAYMOND J [[id number]]   Lab Orders         Basic Metabolic Panel (BMET)         CBC with Differential     Return if symptoms worsen or fail to improve.    Patient to reach out to office if new, worrisome, or unresolved symptoms arise or if no improvement in patient's condition. Patient verbalized understanding  and is agreeable to treatment plan. All questions answered to patient's satisfaction.    Thersia Schuyler Stark, [place]

## 2023-04-05 LAB — CBC WITH DIFFERENTIAL/PLATELET
Basophils Absolute: 0.2 10*3/uL (ref 0.0–0.2)
Basos: 1 %
EOS (ABSOLUTE): 0.2 10*3/uL (ref 0.0–0.4)
Eos: 1 %
Hematocrit: 41.8 % (ref 37.5–51.0)
Hemoglobin: 13.7 g/dL (ref 13.0–17.7)
Lymphocytes Absolute: 2 10*3/uL (ref 0.7–3.1)
Lymphs: 13 %
MCH: 32.3 pg (ref 26.6–33.0)
MCHC: 32.8 g/dL (ref 31.5–35.7)
MCV: 99 fL — ABNORMAL HIGH (ref 79–97)
Monocytes Absolute: 1.7 10*3/uL — ABNORMAL HIGH (ref 0.1–0.9)
Monocytes: 11 %
Neutrophils Absolute: 11.2 10*3/uL — ABNORMAL HIGH (ref 1.4–7.0)
Neutrophils: 72 %
Platelets: 283 10*3/uL (ref 150–450)
RBC: 4.24 x10E6/uL (ref 4.14–5.80)
RDW: 11.8 % (ref 11.6–15.4)
WBC: 15.6 10*3/uL — ABNORMAL HIGH (ref 3.4–10.8)

## 2023-04-05 LAB — BASIC METABOLIC PANEL
BUN/Creatinine Ratio: 21 (ref 10–24)
BUN: 20 mg/dL (ref 8–27)
CO2: 24 mmol/L (ref 20–29)
Calcium: 9.2 mg/dL (ref 8.6–10.2)
Chloride: 100 mmol/L (ref 96–106)
Creatinine, Ser: 0.95 mg/dL (ref 0.76–1.27)
Glucose: 94 mg/dL (ref 70–99)
Potassium: 4.3 mmol/L (ref 3.5–5.2)
Sodium: 138 mmol/L (ref 134–144)
eGFR: 85 mL/min/{1.73_m2} (ref >59)

## 2023-04-05 LAB — IMMATURE CELLS: Metamyelocytes: 2 % — ABNORMAL HIGH (ref 0–0)

## 2023-04-11 NOTE — Progress Notes (Signed)
 Please call patient and let him know that his white blood cell count is still elevated likely due to resolving infection from his hospitalization.  I would like to repeat this by Friday of this week with a CBC.  If possible.  His sodium, potassium and kidney function has all corrected and those look great.

## 2023-04-17 ENCOUNTER — Other Ambulatory Visit (HOSPITAL_BASED_OUTPATIENT_CLINIC_OR_DEPARTMENT_OTHER): Payer: Medicare Other

## 2023-04-17 ENCOUNTER — Other Ambulatory Visit (HOSPITAL_BASED_OUTPATIENT_CLINIC_OR_DEPARTMENT_OTHER): Payer: Self-pay | Admitting: *Deleted

## 2023-04-17 DIAGNOSIS — E876 Hypokalemia: Secondary | ICD-10-CM

## 2023-04-17 DIAGNOSIS — E871 Hypo-osmolality and hyponatremia: Secondary | ICD-10-CM

## 2023-04-17 DIAGNOSIS — J449 Chronic obstructive pulmonary disease, unspecified: Secondary | ICD-10-CM

## 2023-04-17 LAB — CBC WITH DIFFERENTIAL/PLATELET
Basophils Absolute: 0 10*3/uL (ref 0.0–0.2)
Basos: 0 %
EOS (ABSOLUTE): 0.2 10*3/uL (ref 0.0–0.4)
Eos: 2 %
Hematocrit: 39.3 % (ref 37.5–51.0)
Hemoglobin: 13.5 g/dL (ref 13.0–17.7)
Immature Grans (Abs): 0.1 10*3/uL (ref 0.0–0.1)
Immature Granulocytes: 1 %
Lymphocytes Absolute: 2.2 10*3/uL (ref 0.7–3.1)
Lymphs: 29 %
MCH: 33.2 pg — ABNORMAL HIGH (ref 26.6–33.0)
MCHC: 34.4 g/dL (ref 31.5–35.7)
MCV: 97 fL (ref 79–97)
Monocytes Absolute: 1 10*3/uL — ABNORMAL HIGH (ref 0.1–0.9)
Monocytes: 13 %
Neutrophils Absolute: 4 10*3/uL (ref 1.4–7.0)
Neutrophils: 55 %
Platelets: 232 10*3/uL (ref 150–450)
RBC: 4.07 x10E6/uL — ABNORMAL LOW (ref 4.14–5.80)
RDW: 11.9 % (ref 11.6–15.4)
WBC: 7.4 10*3/uL (ref 3.4–10.8)

## 2023-04-18 ENCOUNTER — Encounter (HOSPITAL_BASED_OUTPATIENT_CLINIC_OR_DEPARTMENT_OTHER): Payer: Self-pay | Admitting: Family Medicine

## 2023-04-18 NOTE — Progress Notes (Signed)
Carl Ortega,  Your white blood cell count has improved and back to baseline following your infection. No further testing is needed at this time.

## 2023-05-09 ENCOUNTER — Telehealth: Payer: Self-pay | Admitting: Acute Care

## 2023-05-09 ENCOUNTER — Other Ambulatory Visit: Payer: Self-pay | Admitting: Emergency Medicine

## 2023-05-09 DIAGNOSIS — Z122 Encounter for screening for malignant neoplasm of respiratory organs: Secondary | ICD-10-CM

## 2023-05-09 DIAGNOSIS — F17210 Nicotine dependence, cigarettes, uncomplicated: Secondary | ICD-10-CM

## 2023-05-09 DIAGNOSIS — Z87891 Personal history of nicotine dependence: Secondary | ICD-10-CM

## 2023-05-09 NOTE — Telephone Encounter (Signed)
Lung Cancer Screening Narrative/Criteria Questionnaire (Cigarette Smokers Only- No Cigars/Pipes/vapes)   Carl Ortega   SDMV:05/24/2023 at 12:00 with Dirk Dress NP        02-04-1951   LDCT: 06/18/2023 at 12:30 at [facility]    72 y.o.   Phone: [phone]  Lung Screening Narrative (confirm age 66-77 yrs Medicare / 50-80 yrs Private pay insurance)   Insurance information:[employer]   Referring Provider:Alexis Caudle - PCP   This screening involves an initial phone call with a team member from our program. It is called a shared decision making visit. The initial meeting is required by  insurance and Medicare to make sure you understand the program. This appointment takes about 15-20 minutes to complete. You will complete the screening scan at your scheduled date/time.  This scan takes about 5-10 minutes to complete. You can eat and drink normally before and after the scan.  Criteria questions for Lung Cancer Screening:   Are you a current or former smoker? Current Age began smoking: 73yo   If you are a former smoker, what year did you quit smoking? N/A(within 15 yrs)   To calculate your smoking history, I need an accurate estimate of how many packs of cigarettes you smoked per day and for how many years. (Not just the number of PPD you are now smoking)   Years smoking 52 x Packs per day 1/2 = Pack years 26   (at least 20 pack yrs)   (Make sure they understand that we need to know how much they have smoked in the past, not just the number of PPD they are smoking now)  Do you have a personal history of cancer?  No    Do you have a family history of cancer? No  Are you coughing up blood?  No  Have you had unexplained weight loss of 15 lbs or more in the last 6 months? No  It looks like you meet all criteria.  When would be a good time for [place] to schedule you for this screening?   Additional information: N/A

## 2023-05-18 ENCOUNTER — Encounter (HOSPITAL_BASED_OUTPATIENT_CLINIC_OR_DEPARTMENT_OTHER): Payer: Self-pay

## 2023-05-18 ENCOUNTER — Institutional Professional Consult (permissible substitution) (HOSPITAL_BASED_OUTPATIENT_CLINIC_OR_DEPARTMENT_OTHER): Payer: Medicare Other | Admitting: Pulmonary Disease

## 2023-05-18 ENCOUNTER — Encounter (HOSPITAL_BASED_OUTPATIENT_CLINIC_OR_DEPARTMENT_OTHER): Payer: Self-pay | Admitting: Pulmonary Disease

## 2023-05-24 ENCOUNTER — Ambulatory Visit: Payer: Medicare Other | Admitting: Adult Health

## 2023-05-24 ENCOUNTER — Encounter: Payer: Self-pay | Admitting: Adult Health

## 2023-05-24 DIAGNOSIS — F17210 Nicotine dependence, cigarettes, uncomplicated: Secondary | ICD-10-CM | POA: Diagnosis not present

## 2023-05-24 NOTE — Progress Notes (Signed)
  Virtual Visit via Telephone Note  I connected with Carl Ortega , 05/24/23 12:03 PM by a telemedicine application and verified that I am speaking with the correct person using two identifiers.  Location: Patient: home Provider: home   I discussed the limitations of evaluation and management by telemedicine and the availability of in person appointments. The patient expressed understanding and agreed to proceed.   Shared Decision Making Visit Lung Cancer Screening Program [phone])   Eligibility: 73 y.o. Pack Years Smoking History Calculation = 30 pack years  (# packs/per year x # years smoked) Recent History of coughing up blood  no Unexplained weight loss? no ( >Than 15 pounds within the last 6 months ) Prior History Lung / other cancer no (Diagnosis within the last 5 years already requiring surveillance chest CT Scans). Smoking Status Current Smoker  Visit Components: Discussion included one or more decision making aids. YES Discussion included risk/benefits of screening. YES Discussion included potential follow up diagnostic testing for abnormal scans. YES Discussion included meaning and risk of over diagnosis. YES Discussion included meaning and risk of False Positives. YES Discussion included meaning of total radiation exposure. YES  Counseling Included: Importance of adherence to annual lung cancer LDCT screening. YES Impact of comorbidities on ability to participate in the program. YES Ability and willingness to under diagnostic treatment. YES  Smoking Cessation Counseling: Current Smokers:  Discussed importance of smoking cessation. yes Information about tobacco cessation classes and interventions provided to patient. yes Patient provided with "ticket" for LDCT Scan. yes Symptomatic Patient. NO Diagnosis Code: Tobacco Use Z72.0 Asymptomatic Patient yes  Counseling (Intermediate counseling: > three minutes counseling) N8295 (CT Chest Lung Cancer Screening Low  Dose W/O CM) [id number]  Z12.2-Screening of respiratory organs Z87.891-Personal history of nicotine dependence   Carl Ortega 05/24/23

## 2023-05-24 NOTE — Patient Instructions (Signed)

## 2023-06-18 ENCOUNTER — Ambulatory Visit
Admission: RE | Admit: 2023-06-18 | Discharge: 2023-06-18 | Disposition: A | Discharge status: Home / Self Care | Payer: Medicare Other | Source: Ambulatory Visit | Attending: Acute Care | Admitting: Acute Care

## 2023-06-18 DIAGNOSIS — F17210 Nicotine dependence, cigarettes, uncomplicated: Secondary | ICD-10-CM | POA: Insufficient documentation

## 2023-06-18 DIAGNOSIS — Z87891 Personal history of nicotine dependence: Secondary | ICD-10-CM | POA: Diagnosis present

## 2023-06-18 DIAGNOSIS — Z122 Encounter for screening for malignant neoplasm of respiratory organs: Secondary | ICD-10-CM | POA: Insufficient documentation

## 2023-06-19 ENCOUNTER — Encounter (HOSPITAL_BASED_OUTPATIENT_CLINIC_OR_DEPARTMENT_OTHER): Payer: Self-pay | Admitting: *Deleted

## 2023-06-29 ENCOUNTER — Ambulatory Visit (HOSPITAL_BASED_OUTPATIENT_CLINIC_OR_DEPARTMENT_OTHER): Payer: Medicare Other | Admitting: Family Medicine

## 2023-07-12 ENCOUNTER — Other Ambulatory Visit: Payer: Self-pay | Admitting: Acute Care

## 2023-07-12 ENCOUNTER — Encounter (HOSPITAL_BASED_OUTPATIENT_CLINIC_OR_DEPARTMENT_OTHER): Payer: Self-pay | Admitting: Family Medicine

## 2023-07-12 DIAGNOSIS — Z87891 Personal history of nicotine dependence: Secondary | ICD-10-CM

## 2023-07-12 DIAGNOSIS — J439 Emphysema, unspecified: Secondary | ICD-10-CM | POA: Insufficient documentation

## 2023-07-12 DIAGNOSIS — F17210 Nicotine dependence, cigarettes, uncomplicated: Secondary | ICD-10-CM

## 2023-07-12 DIAGNOSIS — Z122 Encounter for screening for malignant neoplasm of respiratory organs: Secondary | ICD-10-CM

## 2023-07-12 DIAGNOSIS — I700 Atherosclerosis of aorta: Secondary | ICD-10-CM | POA: Insufficient documentation

## 2023-07-16 ENCOUNTER — Encounter (HOSPITAL_BASED_OUTPATIENT_CLINIC_OR_DEPARTMENT_OTHER): Payer: Self-pay | Admitting: Family Medicine

## 2023-07-16 ENCOUNTER — Ambulatory Visit (INDEPENDENT_AMBULATORY_CARE_PROVIDER_SITE_OTHER): Admitting: Family Medicine

## 2023-07-16 ENCOUNTER — Other Ambulatory Visit (HOSPITAL_BASED_OUTPATIENT_CLINIC_OR_DEPARTMENT_OTHER): Payer: Self-pay

## 2023-07-16 VITALS — BP 132/85 | HR 101 | Ht 65.0 in | Wt 159.0 lb

## 2023-07-16 DIAGNOSIS — I10 Essential (primary) hypertension: Secondary | ICD-10-CM | POA: Diagnosis not present

## 2023-07-16 DIAGNOSIS — E781 Pure hyperglyceridemia: Secondary | ICD-10-CM | POA: Diagnosis not present

## 2023-07-16 DIAGNOSIS — R600 Localized edema: Secondary | ICD-10-CM

## 2023-07-16 DIAGNOSIS — J449 Chronic obstructive pulmonary disease, unspecified: Secondary | ICD-10-CM | POA: Diagnosis not present

## 2023-07-16 MED ORDER — BREZTRI AEROSPHERE 160-9-4.8 MCG/ACT IN AERO
2.0000 | INHALATION_SPRAY | Freq: Two times a day (BID) | RESPIRATORY_TRACT | 11 refills | Status: AC
  Filled 2023-07-16: qty 10.7, 30d supply, fill #0

## 2023-07-16 NOTE — Progress Notes (Signed)
 Subjective:   Carl Ortega [birth year] 07/16/2023  Chief Complaint  Patient presents with   Medical Management of Chronic Issues    Pt states he has been having swelling in both of his legs. Denies any pain with the legs. Denies any other concerns for today's visit.    HPI: Carl Ortega presents today for re-assessment and management of chronic medical conditions.  HYPERTENSION: Carl Ortega presents for the medical management of hypertension.  Patient's current hypertension medication regimen is: Amlodipine  10mg , Propanolol 80mg   Patient is  currently taking prescribed medications for HTN.  Patient is  regularly keeping a check on BP at home.  Adhering to low sodium diet: Yes Exercising Regularly: Walking frequently Denies headache, dizziness, CP, SHOB, vision changes.    Patient reports new onset of bilateral lower extremity swelling for approximately 2 to 3 weeks.  Patient denies pain, redness, rash or weakness to bilateral lower extremities.  He has a history of hyponatremia, hypokalemia during multifocal pneumonia hospital admission in January 2025.  He denies shortness of breath or significant weight gain.  Per chart review patient has gained approximately 10 pounds in the past 3 to 4 months.  Denies formation of sores, itching, or drainage from bilateral lower extremities.  He does report taking his amlodipine  in the morning.  His family member Fernando Hoyer accompanying him today reports blood pressure has been well-controlled at home.  BP Readings from Last 3 Encounters:  07/16/23 132/85  04/04/23 115/67  03/28/23 130/75   Wt Readings from Last 3 Encounters:  07/16/23 159 lb (72.1 kg)  04/04/23 149 lb (67.6 kg)  03/27/23 148 lb (67.1 kg)    HYPERLIPIDEMIA: Carl Ortega presents for the medical management of hyperlipidemia.  Patient's current HLD regimen is: Atorvastatin  40mg  Patient is  currently taking prescribed medications for HLD.  Adhering to  heathy diet: Yes, has made diet changes Exercising regularly: Walks frequently  Denies myalgias.  Lab Results  Component Value Date   CHOL 170 02/28/2023   HDL 42 02/28/2023   LDLCALC 89 02/28/2023   TRIG 232 (H) 02/28/2023   CHOLHDL 4.0 02/28/2023   The 10-year ASCVD risk score (Arnett DK, et al., 2019) is: 29.7%   Values used to calculate the score:     Age: 73 years     Sex: Male     Is Non-Hispanic African American: No     Diabetic: No     Tobacco smoker: Yes     Systolic Blood Pressure: 132 mmHg     Is BP treated: Yes     HDL Cholesterol: 42 mg/dL     Total Cholesterol: 170 mg/dL   COPD: Carl Ortega presents for the medical management of COPD.  Current medications: Breztri   Oxygen use: no Rescue inhaler frequency: Rare   Worsening cough or dyspnea: None COPD status:  Patient's caregiver reports COPD symptoms are well-controlled on current Breztri  inhaler.  Patient denies cough, congestion, shortness of breath.  They report needing a refill of inhaler.  Pneumovax: Up to Date Influenza: Up to Date COVID-19: Not up to Date   The following portions of the patient's history were reviewed and updated as appropriate: past medical history, past surgical history, family history, social history, allergies, medications, and problem list.   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, abuse (HCC) 01/30/2023   Episodic mood disorder (HCC) 01/30/2023   Bipolar I disorder (HCC) 01/30/2023   Asthma 01/30/2023    Antisocial personality disorder (HCC) 01/30/2023   Alcohol  dependence, continuous (HCC) 01/30/2023   Chronic schizoaffective schizophrenia (HCC) 01/30/2023   Alcohol  dependence (HCC) 01/30/2023   Cognitive impairment 01/30/2023   Hx of traumatic brain injury 01/30/2023   MDD (major depressive disorder), recurrent severe, 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   Adjustment disorder with disturbance of emotion 04/02/2015   Hyponatremia 03/13/2015   PTSD (post-traumatic stress disorder) 12/03/2014   Essential hypertension 12/03/2014   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   Pure hypertriglyceridemia 03/28/1999   Past Medical History:  Diagnosis Date   Acute hepatitis C 01/30/2023   Allergic rhinitis 01/30/2023   Bipolar 1 disorder (HCC)    Brain injury (HCC)    Cocaine abuse (HCC) 12/03/2014   Cocaine dependence, continuous (HCC) 01/30/2023   Dermatophytosis of nail 01/30/2023   GERD (gastroesophageal reflux disease)    Hernia of abdominal cavity 10/30/2016   Homeless 01/30/2023   Hypertension    Ingrowing nail 01/30/2023   Major depressive disorder, recurrent episode, in full remission (HCC) 01/30/2023   Migraine 01/30/2023   PTSD (post-traumatic stress disorder)    PTSD (post-traumatic stress disorder)    Past Surgical History:  Procedure Laterality Date   COLONOSCOPY WITH PROPOFOL  N/A 01/10/2017   Procedure: COLONOSCOPY WITH PROPOFOL ;  Surgeon: Jerlean Mood, MD;  Location: [facility];  Service: Endoscopy;  Laterality: N/A;   HERNIA REPAIR  2010   left groin   INGUINAL HERNIA REPAIR Right 01/16/2017   Procedure: HERNIA REPAIR INGUINAL ADULT WITH PARTIAL OMENTECTOMY;  Surgeon: Jerlean Mood, MD;  Location: [facility] ORS;  Service: General;  Laterality: Right;   none     Family History  Problem Relation Age of Onset   Hypertension Other    Outpatient Medications Prior to Visit  Medication Sig Dispense Refill   amLODipine  (NORVASC ) 10 MG  tablet Take 1 tablet (10 mg total) by mouth daily. 90 tablet 3   aspirin  EC 81 MG tablet Take 81 mg by mouth daily.     atorvastatin  (LIPITOR) 40 MG tablet Take 1 tablet (40 mg total) by mouth at bedtime. 90 tablet 3   cyanocobalamin  (VITAMIN B12) 1000 MCG tablet Take 1,000 mcg by mouth daily.     Docusate Sodium  (DSS) 100 MG CAPS Take 100 mg by mouth daily.     hydrALAZINE  (APRESOLINE ) 10 MG tablet Take 1 tablet (10 mg total) by mouth 2 (two) times daily as needed (if blood pressure reading is over 150/90). 60 tablet 3   hydroxypropyl methylcellulose / hypromellose (ISOPTO TEARS / GONIOVISC) 2.5 % ophthalmic solution Place 1 drop into both eyes 2 (two) times daily.     loratadine  (CLARITIN ) 10 MG tablet Take 10 mg by mouth daily as needed for allergies.     Multiple Vitamins-Minerals (MULTIVITAMIN WITH MINERALS) tablet Take 1 tablet by mouth daily.     omeprazole (PRILOSEC) 20 MG capsule Take 20 mg by mouth daily.     propranolol  ER (INDERAL  LA) 80 MG 24 hr capsule Take 2 capsules (160 mg total) by mouth daily. 180 capsule 3   QUEtiapine  (SEROQUEL ) 100 MG tablet  Take 1 tablet (100 mg total) by mouth at bedtime. 90 tablet 3   sennosides-docusate sodium  (SENOKOT-S) 8.6-50 MG tablet Take 2 tablets by mouth daily. To prevent constipation     Budeson-Glycopyrrol-Formoterol  (BREZTRI  AEROSPHERE) 160-9-4.8 MCG/ACT AERO Inhale 2 puffs into the lungs 2 (two) times daily. 10.7 g 11   No facility-administered medications prior to visit.   Allergies  Allergen Reactions   Carbamazepine      hyponatremia   Naproxen     Abdominal pain   Sulindac     Abdominal discomfort   Tromethamine    Ketorolac Rash and Hives     ROS: A complete ROS was performed with pertinent positives/negatives noted in the HPI. The remainder of the ROS are negative.    Objective:   Today's Vitals   07/16/23 1529  BP: 132/85  Pulse: (!) 101  SpO2: 97%  Weight: 159 lb (72.1 kg)  Height: 5\' 5"  (1.651 m)    Physical  Exam          GENERAL: Well-appearing, in NAD. Well nourished.  SKIN: Pink, warm and dry. No rash, lesion, ulceration, or ecchymoses.  Head: Normocephalic. NECK: Trachea midline. Full ROM w/o pain or tenderness. No lymphadenopathy.  EARS: Tympanic membranes are intact, translucent without bulging and without drainage. Appropriate landmarks visualized.  EYES: Conjunctiva clear without exudates. EOMI, PERRL, no drainage present.  NOSE: Septum midline w/o deformity. Nares patent, mucosa pink and non-inflamed w/o drainage. No sinus tenderness.  THROAT: Uvula midline. Oropharynx clear. Tonsils non-inflamed without exudate. Mucous membranes pink and moist.  RESPIRATORY: Chest wall symmetrical. Respirations even and non-labored. Breath sounds clear to auscultation bilaterally.  CARDIAC: S1, S2 present, regular rate and rhythm without murmur or gallops. Peripheral pulses 2+ bilaterally.  MSK: Muscle tone and strength appropriate for age. Joints w/o tenderness, redness, or swelling.  EXTREMITIES: Without clubbing, cyanosis, or edema.  NEUROLOGIC: No motor or sensory deficits. Steady, even gait. C2-C12 intact.  PSYCH/MENTAL STATUS: Alert, oriented x 3. Cooperative, appropriate mood and affect.   There are no preventive care reminders to display for this patient.  No results found for any visits on 07/16/23.  The 10-year ASCVD risk score (Arnett DK, et al., 2019) is: 29.7%     Assessment & Plan:  1. Chronic obstructive pulmonary disease, unspecified COPD type (HCC) Currently controlled on Breztri .  Refill sent to [facility] to be dispensed. - budeson-glycopyrrolate -formoterol  (BREZTRI  AEROSPHERE) 160-9-4.8 MCG/ACT AERO inhaler; Inhale 2 puffs into the lungs 2 (two) times daily.  Dispense: 10.7 g; Refill: 11  2. Essential hypertension (Primary) Well-controlled.  Will obtain BMP when fasting within the next 24 hours to evaluate given patient's history of hyponatremia, hypokalemia.   Well-controlled currently on amlodipine  and propranolol .  Patient to continue monitoring blood pressure as directed. - Basic Metabolic Panel (BMET); Future - Brain natriuretic peptide; Future  3. Pure hypertriglyceridemia Patient started omega-3 fish oil and we will assess control with fasting lipid panel in the next 24 hours.  Will titrate medication as needed.  He will continue on atorvastatin  for lipid control as well.  Denies myalgias. - Lipid panel; Future  4. Bilateral leg edema Possibly due to to amlodipine  daytime dosing.  Patient will change amlodipine  administration to bedtime and elevate legs frequently.  Discussed monitoring salt intake.  Will obtain a BMP and BNP to rule out electrolyte changes and fluid overload.  No signs of CHF at this time.   Meds ordered this encounter  Medications   budeson-glycopyrrolate -formoterol  (BREZTRI  AEROSPHERE) 160-9-4.8  MCG/ACT AERO inhaler    Sig: Inhale 2 puffs into the lungs 2 (two) times daily.    Dispense:  10.7 g    Refill:  11    Supervising Provider:   DE [place], RAYMOND J [[id number]]    Lab Orders         Basic Metabolic Panel (BMET)         Brain natriuretic peptide         Lipid panel    No images are attached to the encounter or orders placed in the encounter.  Return in about 6 months (around 01/15/2024) for ANNUAL PHYSICAL; Chronic Conditions (fasting labs at time of visit) .    Patient to reach out to office if new, worrisome, or unresolved symptoms arise or if no improvement in patient's condition. Patient verbalized understanding and is agreeable to treatment plan. All questions answered to patient's satisfaction.    Nonda Bays, [place]

## 2023-07-16 NOTE — Patient Instructions (Signed)
 Start taking your Amlodipine  at NIGHTTIME.   If leg swelling does not improve in 1-2 weeks, reach out to PCP.

## 2023-09-21 ENCOUNTER — Ambulatory Visit (INDEPENDENT_AMBULATORY_CARE_PROVIDER_SITE_OTHER): Admitting: Podiatry

## 2023-09-21 DIAGNOSIS — Z91199 Patient's noncompliance with other medical treatment and regimen due to unspecified reason: Secondary | ICD-10-CM

## 2023-09-21 NOTE — Progress Notes (Signed)
 1. No-show for appointment

## 2023-11-20 ENCOUNTER — Ambulatory Visit (INDEPENDENT_AMBULATORY_CARE_PROVIDER_SITE_OTHER): Admitting: Podiatry

## 2023-11-20 DIAGNOSIS — Q828 Other specified congenital malformations of skin: Secondary | ICD-10-CM | POA: Diagnosis not present

## 2023-11-20 NOTE — Progress Notes (Signed)
 Subjective:  Patient ID: Carl Ortega, male    DOB: Jun 08, 1950,  MRN: [id number]  Chief Complaint  Patient presents with   Callouses    73 y.o. male presents with the above complaint.  Patient presents with right submetatarsal 5 porokeratotic lesion painful to touch is progressive gotten worse worse with ambulation and shoe pressure would like to discuss treatment options for he has not seen MRIs prior to seeing me denies any other acute complaints pain scale 7 out of 10 dull aching nature   Review of Systems: Negative except as noted in the HPI. Denies N/V/F/Ch.  Past Medical History:  Diagnosis Date   Acute hepatitis C 01/30/2023   Alcohol  dependence (HCC) 01/30/2023   Allergic rhinitis 01/30/2023   Bipolar 1 disorder (HCC)    Brain injury (HCC)    Cocaine abuse (HCC) 12/03/2014   Cocaine dependence, continuous (HCC) 01/30/2023   Dermatophytosis of nail 01/30/2023   GERD (gastroesophageal reflux disease)    Hernia of abdominal cavity 10/30/2016   Homeless 01/30/2023   Hypertension    Hypokalemia 03/28/2023   Hyponatremia 03/13/2015   Hypophosphatemia 03/28/2023   Ingrowing nail 01/30/2023   Major depressive disorder, recurrent episode, in full remission (HCC) 01/30/2023   Migraine 01/30/2023   Multifocal pneumonia 03/28/2023   PTSD (post-traumatic stress disorder)    PTSD (post-traumatic stress disorder)     Current Outpatient Medications:    amLODipine  (NORVASC ) 10 MG tablet, Take 1 tablet (10 mg total) by mouth daily., Disp: 90 tablet, Rfl: 3   aspirin  EC 81 MG tablet, Take 81 mg by mouth daily., Disp: , Rfl:    atorvastatin  (LIPITOR) 40 MG tablet, Take 1 tablet (40 mg total) by mouth at bedtime., Disp: 90 tablet, Rfl: 3   budeson-glycopyrrolate -formoterol  (BREZTRI  AEROSPHERE) 160-9-4.8 MCG/ACT AERO inhaler, Inhale 2 puffs into the lungs 2 (two) times daily., Disp: 10.7 g, Rfl: 11   cyanocobalamin  (VITAMIN B12) 1000 MCG tablet, Take 1,000 mcg by mouth daily., Disp: ,  Rfl:    Docusate Sodium  (DSS) 100 MG CAPS, Take 100 mg by mouth daily., Disp: , Rfl:    hydrALAZINE  (APRESOLINE ) 10 MG tablet, Take 1 tablet (10 mg total) by mouth 2 (two) times daily as needed (if blood pressure reading is over 150/90)., Disp: 60 tablet, Rfl: 3   hydroxypropyl methylcellulose / hypromellose (ISOPTO TEARS / GONIOVISC) 2.5 % ophthalmic solution, Place 1 drop into both eyes 2 (two) times daily., Disp: , Rfl:    loratadine  (CLARITIN ) 10 MG tablet, Take 10 mg by mouth daily as needed for allergies., Disp: , Rfl:    Multiple Vitamins-Minerals (MULTIVITAMIN WITH MINERALS) tablet, Take 1 tablet by mouth daily., Disp: , Rfl:    omeprazole (PRILOSEC) 20 MG capsule, Take 20 mg by mouth daily., Disp: , Rfl:    propranolol  ER (INDERAL  LA) 80 MG 24 hr capsule, Take 2 capsules (160 mg total) by mouth daily., Disp: 180 capsule, Rfl: 3   QUEtiapine  (SEROQUEL ) 100 MG tablet, Take 1 tablet (100 mg total) by mouth at bedtime., Disp: 90 tablet, Rfl: 3   sennosides-docusate sodium  (SENOKOT-S) 8.6-50 MG tablet, Take 2 tablets by mouth daily. To prevent constipation, Disp: , Rfl:   Social History   Tobacco Use  Smoking Status Every Day   Current packs/day: 0.25   Types: Cigarettes  Smokeless Tobacco Former    Allergies  Allergen Reactions   Carbamazepine      hyponatremia   Naproxen     Abdominal pain   Sulindac  Abdominal discomfort   Tromethamine    Ketorolac Rash and Hives   Objective:  There were no vitals filed for this visit. There is no height or weight on file to calculate BMI. Constitutional Well developed. Well nourished.  Vascular Dorsalis pedis pulses palpable bilaterally. Posterior tibial pulses palpable bilaterally. Capillary refill normal to all digits.  No cyanosis or clubbing noted. Pedal hair growth normal.  Neurologic Normal speech. Oriented to person, place, and time. Epicritic sensation to light touch grossly present bilaterally.  Dermatologic Right submet  5 porokeratotic lesion with central nucleated core noted pain on palpation.  Orthopedic: Normal joint ROM without pain or crepitus bilaterally. No visible deformities. No bony tenderness.   Radiographs: None Assessment:   1. Porokeratosis    Plan:  Patient was evaluated and treated and all questions answered.  Right submet 5 porokeratosis - All questions and concerns were discussed with the patient in extensive detail - Given the amount of pain that she experiences she benefit from aggressive debridement of the lesion using chisel blade handle the lesion w was debrided down to healthy stripe tissue no complication or no pinpoint bleeding noted - Shoe gear modification discussed  No follow-ups on file.

## 2023-12-27 ENCOUNTER — Encounter (HOSPITAL_BASED_OUTPATIENT_CLINIC_OR_DEPARTMENT_OTHER): Payer: Self-pay | Admitting: *Deleted

## 2024-04-02 ENCOUNTER — Other Ambulatory Visit (HOSPITAL_BASED_OUTPATIENT_CLINIC_OR_DEPARTMENT_OTHER): Payer: Self-pay | Admitting: Family Medicine
